# Patient Record
Sex: Male | Born: 1960 | Race: Black or African American | Hispanic: No | Marital: Married | Smoking: Never smoker
Health system: Southern US, Community
[De-identification: ages and names within clinical notes are randomized; demographics above are authoritative.]

## PROBLEM LIST (undated history)

## (undated) DIAGNOSIS — F5104 Psychophysiologic insomnia: Secondary | ICD-10-CM

## (undated) DIAGNOSIS — I10 Essential (primary) hypertension: Secondary | ICD-10-CM

## (undated) DIAGNOSIS — G4733 Obstructive sleep apnea (adult) (pediatric): Secondary | ICD-10-CM

## (undated) HISTORY — DX: Psychophysiologic insomnia: F51.04

## (undated) HISTORY — PX: OTHER SURGICAL HISTORY: SHX169

## (undated) HISTORY — DX: Obstructive sleep apnea (adult) (pediatric): G47.33

## (undated) HISTORY — DX: Essential (primary) hypertension: I10

---

## 1998-11-02 ENCOUNTER — Emergency Department (HOSPITAL_COMMUNITY): Admission: EM | Admit: 1998-11-02 | Discharge: 1998-11-02 | Payer: Self-pay | Admitting: Emergency Medicine

## 1999-05-26 ENCOUNTER — Emergency Department (HOSPITAL_COMMUNITY): Admission: EM | Admit: 1999-05-26 | Discharge: 1999-05-26 | Payer: Self-pay | Admitting: Emergency Medicine

## 2004-08-27 ENCOUNTER — Emergency Department (HOSPITAL_COMMUNITY): Admission: EM | Admit: 2004-08-27 | Discharge: 2004-08-27 | Payer: Self-pay | Admitting: Emergency Medicine

## 2004-08-29 ENCOUNTER — Emergency Department (HOSPITAL_COMMUNITY): Admission: EM | Admit: 2004-08-29 | Discharge: 2004-08-29 | Payer: Self-pay | Admitting: Emergency Medicine

## 2006-02-13 ENCOUNTER — Emergency Department (HOSPITAL_COMMUNITY): Admission: EM | Admit: 2006-02-13 | Discharge: 2006-02-13 | Payer: Self-pay | Admitting: Emergency Medicine

## 2006-05-30 ENCOUNTER — Emergency Department (HOSPITAL_COMMUNITY): Admission: EM | Admit: 2006-05-30 | Discharge: 2006-05-30 | Payer: Self-pay | Admitting: Emergency Medicine

## 2006-06-04 ENCOUNTER — Emergency Department (HOSPITAL_COMMUNITY): Admission: EM | Admit: 2006-06-04 | Discharge: 2006-06-04 | Payer: Self-pay | Admitting: Family Medicine

## 2008-01-17 ENCOUNTER — Emergency Department (HOSPITAL_COMMUNITY): Admission: EM | Admit: 2008-01-17 | Discharge: 2008-01-17 | Payer: Self-pay | Admitting: Emergency Medicine

## 2009-03-15 ENCOUNTER — Emergency Department (HOSPITAL_COMMUNITY): Admission: EM | Admit: 2009-03-15 | Discharge: 2009-03-15 | Payer: Self-pay | Admitting: Family Medicine

## 2009-03-15 ENCOUNTER — Telehealth: Payer: Self-pay | Admitting: Internal Medicine

## 2009-03-22 ENCOUNTER — Ambulatory Visit: Payer: Self-pay | Admitting: Internal Medicine

## 2009-03-22 DIAGNOSIS — F528 Other sexual dysfunction not due to a substance or known physiological condition: Secondary | ICD-10-CM

## 2009-03-22 DIAGNOSIS — I10 Essential (primary) hypertension: Secondary | ICD-10-CM | POA: Insufficient documentation

## 2009-04-02 ENCOUNTER — Emergency Department (HOSPITAL_COMMUNITY): Admission: EM | Admit: 2009-04-02 | Discharge: 2009-04-02 | Payer: Self-pay | Admitting: Emergency Medicine

## 2009-06-26 ENCOUNTER — Telehealth: Payer: Self-pay | Admitting: Internal Medicine

## 2009-06-27 ENCOUNTER — Ambulatory Visit: Payer: Self-pay | Admitting: Internal Medicine

## 2009-09-07 ENCOUNTER — Telehealth: Payer: Self-pay | Admitting: Internal Medicine

## 2009-09-08 ENCOUNTER — Telehealth: Payer: Self-pay | Admitting: Internal Medicine

## 2009-10-16 ENCOUNTER — Telehealth: Payer: Self-pay | Admitting: Internal Medicine

## 2009-11-21 ENCOUNTER — Telehealth: Payer: Self-pay | Admitting: Internal Medicine

## 2010-03-21 ENCOUNTER — Encounter: Payer: Self-pay | Admitting: Internal Medicine

## 2010-03-21 ENCOUNTER — Other Ambulatory Visit: Payer: Self-pay | Admitting: Internal Medicine

## 2010-03-21 ENCOUNTER — Ambulatory Visit
Admission: RE | Admit: 2010-03-21 | Discharge: 2010-03-21 | Payer: Self-pay | Source: Home / Self Care | Attending: Internal Medicine | Admitting: Internal Medicine

## 2010-03-21 ENCOUNTER — Telehealth: Payer: Self-pay | Admitting: Internal Medicine

## 2010-03-21 DIAGNOSIS — G4730 Sleep apnea, unspecified: Secondary | ICD-10-CM | POA: Insufficient documentation

## 2010-03-21 DIAGNOSIS — G4700 Insomnia, unspecified: Secondary | ICD-10-CM | POA: Insufficient documentation

## 2010-03-21 DIAGNOSIS — R9431 Abnormal electrocardiogram [ECG] [EKG]: Secondary | ICD-10-CM | POA: Insufficient documentation

## 2010-03-21 LAB — CBC WITH DIFFERENTIAL/PLATELET
Basophils Absolute: 0 10*3/uL (ref 0.0–0.1)
Eosinophils Absolute: 0.1 10*3/uL (ref 0.0–0.7)
Hemoglobin: 13.9 g/dL (ref 13.0–17.0)
Lymphocytes Relative: 42.3 % (ref 12.0–46.0)
MCHC: 33.7 g/dL (ref 30.0–36.0)
Monocytes Relative: 9.9 % (ref 3.0–12.0)
Neutro Abs: 2.6 10*3/uL (ref 1.4–7.7)
Neutrophils Relative %: 46.3 % (ref 43.0–77.0)
Platelets: 251 10*3/uL (ref 150.0–400.0)
RDW: 12.4 % (ref 11.5–14.6)

## 2010-03-21 LAB — LIPID PANEL
Cholesterol: 121 mg/dL (ref 0–200)
HDL: 37 mg/dL — ABNORMAL LOW (ref >39.00)
Triglycerides: 102 mg/dL (ref 0.0–149.0)
VLDL: 20.4 mg/dL (ref 0.0–40.0)

## 2010-03-21 LAB — URINALYSIS, ROUTINE W REFLEX MICROSCOPIC
Bilirubin Urine: NEGATIVE
Hemoglobin, Urine: NEGATIVE
Leukocytes, UA: NEGATIVE
Nitrite: NEGATIVE
pH: 5.5 (ref 5.0–8.0)

## 2010-03-21 LAB — HEPATIC FUNCTION PANEL
Albumin: 4.2 g/dL (ref 3.5–5.2)
Alkaline Phosphatase: 52 U/L (ref 39–117)
Total Bilirubin: 0.9 mg/dL (ref 0.3–1.2)

## 2010-03-21 LAB — BASIC METABOLIC PANEL
BUN: 13 mg/dL (ref 6–23)
CO2: 28 mEq/L (ref 19–32)
Calcium: 9.2 mg/dL (ref 8.4–10.5)
Creatinine, Ser: 1.3 mg/dL (ref 0.4–1.5)
GFR: 75.73 mL/min (ref >60.00)
Glucose, Bld: 115 mg/dL — ABNORMAL HIGH (ref 70–99)

## 2010-03-21 LAB — PSA: PSA: 0.75 ng/mL (ref 0.10–4.00)

## 2010-03-29 NOTE — Assessment & Plan Note (Signed)
Summary: NEW [employer] PT-PKG/OFF--#--BP 170/SOMETHING SEEN/[facility] CARE-ER IF...   Vital Signs:  Patient profile:   50 year old male Height:      72 inches Weight:      214 pounds BMI:     29.13 O2 Sat:      96 % on Room air Temp:     97.1 degrees F oral Pulse rate:   80 / minute Pulse rhythm:   regular Resp:     16 per minute BP sitting:   116 / 82  (right arm)  Nutrition Counseling: Patient's BMI is greater than 25 and therefore counseled on weight management options.  O2 Flow:  Room air  Primary Care Provider:  Janith Lima MD   History of Present Illness: New to me he needs a BP check, he was seen at [facility] 2 weeks ago and started on meds for hypertension which he tolerates though he has had some decreased libido and ED since starting meds (I think Clonidine is the culprit)  Hypertension History:      He complains of side effects from treatment, but denies headache, chest pain, palpitations, dyspnea with exertion, orthopnea, PND, peripheral edema, visual symptoms, neurologic problems, and syncope.  He notes the following problems with antihypertensive medication side effects: ED.        Positive major cardiovascular risk factors include male age 23 years old or older and hypertension.  Negative major cardiovascular risk factors include no history of diabetes or hyperlipidemia, negative family history for ischemic heart disease, and non-tobacco-user status.        Further assessment for target organ damage reveals no history of ASHD, cardiac end-organ damage (CHF/LVH), stroke/TIA, peripheral vascular disease, renal insufficiency, or hypertensive retinopathy.     Preventive Screening-Counseling & Management  Alcohol-Tobacco     Alcohol drinks/day: <1     Alcohol type: beer     >5/day in last 3 mos: no     Alcohol Counseling: not indicated; use of alcohol is not excessive or problematic     Feels need to cut down: no     Feels annoyed by complaints: no     Feels guilty re:  drinking: no     Needs 'eye opener' in am: no     Smoking Status: never  Caffeine-Diet-Exercise     Does Patient Exercise: yes  Hep-HIV-STD-Contraception     Hepatitis Risk: no risk noted     HIV Risk: no risk noted     STD Risk: no risk noted      Sexual History:  currently monogamous.        Drug Use:  never and no.        Blood Transfusions:  no.    Past History:  Past Medical History: Hypertension  Past Surgical History: Denies surgical history  Family History: Family History Hypertension  Social History: Occupation: Freight forwarder Married Never Smoked Alcohol use-yes Drug use-no Regular exercise-yes Smoking Status:  never Drug Use:  never, no Does Patient Exercise:  yes Hepatitis Risk:  no risk noted HIV Risk:  no risk noted STD Risk:  no risk noted Sexual History:  currently monogamous Blood Transfusions:  no  Review of Systems       The patient complains of weight gain.  The patient denies anorexia, fever, abdominal pain, difficulty walking, and depression.   GU:  Complains of decreased libido and erectile dysfunction; denies discharge, dysuria, hematuria, incontinence, nocturia, urinary frequency, and urinary hesitancy.  Physical Exam  General:  alert, well-developed, well-nourished, well-hydrated, appropriate dress, normal appearance, healthy-appearing, cooperative to examination, and overweight-appearing.   Head:  normocephalic, atraumatic, no abnormalities observed, and no abnormalities palpated.   Eyes:  No corneal or conjunctival inflammation noted. EOMI. Perrla. Funduscopic exam benign, without hemorrhages, exudates or papilledema. Vision grossly normal. Mouth:  Oral mucosa and oropharynx without lesions or exudates.  Teeth in good repair. Neck:  supple, full ROM, no masses, no thyromegaly, no thyroid nodules or tenderness, no JVD, normal carotid upstroke, and no carotid bruits.   Lungs:  normal respiratory effort, no intercostal retractions,  no accessory muscle use, normal breath sounds, no dullness, no fremitus, no crackles, and no wheezes.   Heart:  normal rate, regular rhythm, no murmur, no gallop, no rub, and no JVD.   Abdomen:  soft, non-tender, normal bowel sounds, no distention, no masses, no guarding, no rigidity, no hepatomegaly, and no splenomegaly.   Msk:  No deformity or scoliosis noted of thoracic or lumbar spine.   Pulses:  R and L carotid,radial,femoral,dorsalis pedis and posterior tibial pulses are full and equal bilaterally Extremities:  No clubbing, cyanosis, edema, or deformity noted with normal full range of motion of all joints.   Neurologic:  No cranial nerve deficits noted. Station and gait are normal. Plantar reflexes are down-going bilaterally. DTRs are symmetrical throughout. Sensory, motor and coordinative functions appear intact. Skin:  Intact without suspicious lesions or rashes Cervical Nodes:  No lymphadenopathy noted Psych:  Cognition and judgment appear intact. Alert and cooperative with normal attention span and concentration. No apparent delusions, illusions, hallucinations Additional Exam:  EKG is normal.   Impression & Recommendations:  Problem # 1:  ERECTILE DYSFUNCTION (ICD-302.72) Assessment New  His updated medication list for this problem includes:    Cialis 20 Mg Tabs (Tadalafil) .Marland Kitchen... Take as directed  Discussed proper use of medications, as well as side effects.   Problem # 2:  ESSENTIAL HYPERTENSION (ICD-401.9) Assessment: New  His updated medication list for this problem includes:    Azor 10-40 Mg Tabs (Amlodipine-olmesartan) ..... Once daily for high blood pressure  BP today: 116/82  Orders: EKG w/ Interpretation (93000)  Complete Medication List: 1)  Azor 10-40 Mg Tabs (Amlodipine-olmesartan) .... Once daily for high blood pressure 2)  Cialis 20 Mg Tabs (Tadalafil) .... Take as directed  Hypertension Assessment/Plan:      The patient's hypertensive risk group is  category B: At least one risk factor (excluding diabetes) with no target organ damage.  Today's blood pressure is 116/82.  His blood pressure goal is < 140/90.  Patient Instructions: 1)  Please schedule a follow-up appointment in 2 months. 2)  It is important that you exercise regularly at least 20 minutes 5 times a week. If you develop chest pain, have severe difficulty breathing, or feel very tired , stop exercising immediately and seek medical attention. 3)  You need to lose weight. Consider a lower calorie diet and regular exercise.  4)  Check your Blood Pressure regularly. If it is above 140/90: you should make an appointment. Prescriptions: CIALIS 20 MG TABS (TADALAFIL) Take as directed  #3 x 11   Entered and Authorized by:   Janith Lima MD   Signed by:   Janith Lima MD on 03/22/2009   Method used:   Print then Give to Patient   RxID:   [id number]    Tetanus/Td Immunization History:    Tetanus/Td # 1:  Tdap (06/03/2007)

## 2010-03-29 NOTE — Progress Notes (Signed)
Summary: SAMPLES  Phone Note Call from Patient Call back at Home Phone [phone]   Summary of Call: Patient is requesting samples of BP medication.  Initial call taken by: Charlsie Quest, [place],  November 21, 2009 2:16 PM  Follow-up for Phone Call        Pt informed  Follow-up by: Charlsie Quest, CMA,  November 22, 2009 11:37 AM    Prescriptions: AZOR 10-40 MG TABS (AMLODIPINE-OLMESARTAN) once daily for high blood pressure  #45 x 0   Entered by:   Charlsie Quest, CMA   Authorized by:   Janith Lima MD   Signed by:   Charlsie Quest, CMA on 11/22/2009   Method used:   Samples Given   RxID:   [id number]

## 2010-03-29 NOTE — Assessment & Plan Note (Signed)
Summary: elevated BP SD   Vital Signs:  Patient profile:   50 year old male Height:      72 inches Weight:      214 pounds BMI:     29.13 O2 Sat:      96 % on Room air Temp:     98.4 degrees F oral Pulse rate:   72 / minute Pulse rhythm:   regular Resp:     16 per minute BP sitting:   138 / 90  (left arm) Cuff size:   large  Vitals Entered By: Estell Harpin CMA (Jun 27, 2009 10:40 AM)  Nutrition Counseling: Patient's BMI is greater than 25 and therefore counseled on weight management options.  O2 Flow:  Room air CC: follow-up visit//discuss elevated BP and med refill Is Patient Diabetic? No Pain Assessment Patient in pain? no        Primary Care Provider:  Janith Lima MD  CC:  follow-up visit//discuss elevated BP and med refill.  History of Present Illness:  Hypertension Follow-Up      This is a 50 year old man who presents for Hypertension follow-up.  The patient denies lightheadedness, urinary frequency, headaches, edema, impotence, rash, and fatigue.  The patient denies the following associated symptoms: chest pain, chest pressure, exercise intolerance, dyspnea, palpitations, syncope, leg edema, and pedal edema.  Compliance with medications (by patient report) has been sporadic.  The patient reports that dietary compliance has been good.  The patient reports exercising daily.  Adjunctive measures currently used by the patient include salt restriction and relaxation.    Preventive Screening-Counseling & Management  Alcohol-Tobacco     Alcohol drinks/day: <1     Alcohol type: beer     >5/day in last 3 mos: no     Alcohol Counseling: not indicated; use of alcohol is not excessive or problematic     Feels need to cut down: no     Feels annoyed by complaints: no     Feels guilty re: drinking: no     Needs 'eye opener' in am: no     Smoking Status: never  Hep-HIV-STD-Contraception     Hepatitis Risk: no risk noted     HIV Risk: no risk noted     STD Risk: no  risk noted  Medications Prior to Update: 1)  Azor 10-40 Mg Tabs (Amlodipine-Olmesartan) .... Once Daily For High Blood Pressure 2)  Cialis 20 Mg Tabs (Tadalafil) .... Take As Directed  Current Medications (verified): 1)  Azor 10-40 Mg Tabs (Amlodipine-Olmesartan) .... Once Daily For High Blood Pressure 2)  Cialis 20 Mg Tabs (Tadalafil) .... Take As Directed  Allergies (verified): No Known Drug Allergies  Past History:  Past Medical History: Reviewed history from 03/22/2009 and no changes required. Hypertension  Past Surgical History: Reviewed history from 03/22/2009 and no changes required. Denies surgical history  Family History: Reviewed history from 03/22/2009 and no changes required. Family History Hypertension  Social History: Reviewed history from 03/22/2009 and no changes required. 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    Physical Exam  General:  alert, well-developed, well-nourished, well-hydrated, appropriate dress, normal appearance, healthy-appearing, cooperative to examination, and overweight-appearing.   Mouth:  Oral mucosa and oropharynx without lesions or exudates.  Teeth in good repair. Neck:  supple, full ROM, no masses, no thyromegaly, no thyroid nodules  or tenderness, no JVD, normal carotid upstroke, and no carotid bruits.   Lungs:  normal respiratory effort, no intercostal retractions, no accessory muscle use, normal breath sounds, no dullness, no fremitus, no crackles, and no wheezes.   Heart:  normal rate, regular rhythm, no murmur, no gallop, no rub, and no JVD.   Abdomen:  soft, non-tender, normal bowel sounds, no distention, no masses, no guarding, no rigidity, no hepatomegaly, and no splenomegaly.   Msk:  No deformity or scoliosis noted of thoracic or lumbar  spine.   Pulses:  R and L carotid,radial,femoral,dorsalis pedis and posterior tibial pulses are full and equal bilaterally Extremities:  No clubbing, cyanosis, edema, or deformity noted with normal full range of motion of all joints.   Neurologic:  No cranial nerve deficits noted. Station and gait are normal. Plantar reflexes are down-going bilaterally. DTRs are symmetrical throughout. Sensory, motor and coordinative functions appear intact. Skin:  Intact without suspicious lesions or rashes Cervical Nodes:  No lymphadenopathy noted Psych:  Cognition and judgment appear intact. Alert and cooperative with normal attention span and concentration. No apparent delusions, illusions, hallucinations   Impression & Recommendations:  Problem # 1:  HYPERTENSION (ICD-401.9) Assessment Unchanged  His updated medication list for this problem includes:    Azor 10-40 Mg Tabs (Amlodipine-olmesartan) ..... Once daily for high blood pressure  BP today: 138/90 Prior BP: 116/82 (03/22/2009)  Prior 10 Yr Risk Heart Disease: Not enough information (03/22/2009)  Complete Medication List: 1)  Azor 10-40 Mg Tabs (Amlodipine-olmesartan) .... Once daily for high blood pressure 2)  Cialis 20 Mg Tabs (Tadalafil) .... Take as directed  Patient Instructions: 1)  Please schedule a follow-up appointment in 4 months. 2)  It is important that you exercise regularly at least 20 minutes 5 times a week. If you develop chest pain, have severe difficulty breathing, or feel very tired , stop exercising immediately and seek medical attention. 3)  You need to lose weight. Consider a lower calorie diet and regular exercise.  4)  Check your Blood Pressure regularly. If it is above 140/90: you should make an appointment.

## 2010-03-29 NOTE — Progress Notes (Signed)
  Phone Note Other Incoming   Caller: [facility] Summary of Call: Pt cannot afford Azor, he needs another alternative. Please Advise. Initial call taken by: Ami Bullins CMA,  September 08, 2009 8:54 AM  Follow-up for Phone Call        will defer to Dr. Ronnald Ramp regarding medication changes. continue present meds until he can address this next week.  Follow-up by: Neena Rhymes MD,  September 08, 2009 1:18 PM  Additional Follow-up for Phone Call Additional follow up Details #1::        See 09/07/09 phone note//closing phone note.Ellison Hughs Archie CMA  September 08, 2009 1:32 PM

## 2010-03-29 NOTE — Progress Notes (Signed)
Summary: OK FOR RF?   Phone Note Refill Request Call back at [phone] - Wife Melissa   Refills Requested: Medication #1:  CIALIS 20 MG TABS Take as directed Initial call taken by: Charlsie Quest, [place],  March 21, 2010 11:55 AM    Prescriptions: CIALIS 20 MG TABS (TADALAFIL) Take as directed  #3 x 11   Entered and Authorized by:   Janith Lima MD   Signed by:   Janith Lima MD on 03/21/2010   Method used:   Electronically to        [facility] [phone]* (retail)       [address]       [place], [place]  [address]       Ph: [phone] or [phone]       Fax: [phone]   RxID:   [id number]

## 2010-03-29 NOTE — Assessment & Plan Note (Signed)
Summary: blood pressure/#/cd   Vital Signs:  Patient profile:   50 year old male Height:      72 inches Weight:      247 pounds BMI:     33.62 O2 Sat:      96 % on Room air Temp:     97.9 degrees F oral Pulse rate:   74 / minute Pulse rhythm:   regular Resp:     16 per minute BP sitting:   140 / 88  (left arm) Cuff size:   large  Vitals Entered By: Estell Harpin CMA (March 21, 2010 9:48 AM)  Nutrition Counseling: Patient's BMI is greater than 25 and therefore counseled on weight management options.  O2 Flow:  Room air CC: follow-up visit, Insomnia, Hypertension Management, Preventive Care Is Patient Diabetic? No Pain Assessment Patient in pain? no       Does patient need assistance? Functional Status Self care Ambulation Normal   Primary Care Provider:  Janith Lima MD  CC:  follow-up visit, Insomnia, Hypertension Management, and Preventive Care.  History of Present Illness:  Insomnia      This is a 50 year old man who presents with Insomnia.  The severity is described as mild.  The patient reports difficulty falling asleep, frequent awakening, snoring, apnea noted by partner, and daytime somnolence, but denies early awakening, nightmares, and leg movements.  Associated symptoms include weight gain.  Risk factors for insomnia include obesity and working third shift.  Behaviors that may contribute to insomnia include OTC sleep aids.    He also tells me that he does not think that Cialis is working b/c he still has low libido and ED, although he also tells me that his wife wants to have sex "all day, every day" and he can't keep up with her demands.  He tells me that he has had some remote SOB but none recently and he denies any CP or DOE.  Hypertension History:      He denies headache, chest pain, palpitations, dyspnea with exertion, orthopnea, PND, peripheral edema, visual symptoms, neurologic problems, syncope, and side effects from treatment.  He notes no  problems with any antihypertensive medication side effects.        Positive major cardiovascular risk factors include male age 51 years old or older and hypertension.  Negative major cardiovascular risk factors include no history of diabetes or hyperlipidemia, negative family history for ischemic heart disease, and non-tobacco-user status.        Further assessment for target organ damage reveals no history of ASHD, cardiac end-organ damage (CHF/LVH), stroke/TIA, peripheral vascular disease, renal insufficiency, or hypertensive retinopathy.     Current Medications (verified): 1)  Azor 10-40 Mg Tabs (Amlodipine-Olmesartan) .... Once Daily For High Blood Pressure 2)  Cialis 20 Mg Tabs (Tadalafil) .... Take As Directed  Allergies (verified): No Known Drug Allergies  Past History:  Past Medical History: Last updated: 03/22/2009 Hypertension  Past Surgical History: Last updated: 03/22/2009 Denies surgical history  Family History: Last updated: 03/22/2009 Family History Hypertension  Social History: Last updated: 03/22/2009 Occupation: Freight forwarder Married Never Smoked Alcohol use-yes Drug use-no Regular exercise-yes  Risk Factors: Alcohol Use: <1 (06/27/2009) >5 drinks/d w/in last 3 months: no (06/27/2009) Exercise: yes (03/22/2009)  Risk Factors: Smoking Status: never (06/27/2009)  Family History: Reviewed history from 03/22/2009 and no changes required. Family History Hypertension  Social History: Reviewed history from 03/22/2009 and no changes required. Occupation: Freight forwarder Married Never Smoked Alcohol use-yes Drug  use-no Regular exercise-yes  Review of Systems       The patient complains of weight gain.  The patient denies anorexia, fever, weight loss, chest pain, syncope, dyspnea on exertion, peripheral edema, prolonged cough, headaches, hemoptysis, abdominal pain, hematuria, incontinence, genital sores, suspicious skin lesions, transient  blindness, difficulty walking, depression, abnormal bleeding, enlarged lymph nodes, angioedema, and testicular masses.   Resp:  Complains of excessive snoring and hypersomnolence; denies chest discomfort, chest pain with inspiration, cough, coughing up blood, morning headaches, pleuritic, shortness of breath, sputum productive, and wheezing. GU:  Complains of decreased libido and erectile dysfunction; denies discharge, dysuria, genital sores, hematuria, incontinence, nocturia, urinary frequency, and urinary hesitancy. Psych:  Denies anxiety, depression, easily angered, easily tearful, irritability, mental problems, panic attacks, sense of great danger, suicidal thoughts/plans, and thoughts of violence.  Physical Exam  General:  alert, well-developed, well-nourished, well-hydrated, appropriate dress, normal appearance, healthy-appearing, cooperative to examination, good hygiene, and overweight-appearing.   Head:  normocephalic, atraumatic, no abnormalities observed, and no abnormalities palpated.   Eyes:  vision grossly intact, pupils equal, pupils round, and pupils reactive to light.   Ears:  R ear normal and L ear normal.   Nose:  External nasal examination shows no deformity or inflammation. Nasal mucosa are pink and moist without lesions or exudates. Mouth:  Oral mucosa and oropharynx without lesions or exudates.  Teeth in good repair. Neck:  supple, full ROM, no masses, no thyromegaly, no thyroid nodules or tenderness, no JVD, no HJR, normal carotid upstroke, no carotid bruits, no cervical lymphadenopathy, and no neck tenderness.   Lungs:  normal respiratory effort, no intercostal retractions, no accessory muscle use, normal breath sounds, no dullness, no fremitus, no crackles, and no wheezes.   Heart:  normal rate, regular rhythm, no murmur, no gallop, no rub, and no JVD.   Abdomen:  soft, non-tender, normal bowel sounds, no distention, no masses, no guarding, no rigidity, no rebound tenderness,  no abdominal hernia, no inguinal hernia, no hepatomegaly, and no splenomegaly.   Rectal:  No external abnormalities noted. Normal sphincter tone. No rectal masses or tenderness. Genitalia:  uncircumcised, no hydrocele, no varicocele, no scrotal masses, no testicular masses or atrophy, no cutaneous lesions, and no urethral discharge.   Prostate:  no gland enlargement, no nodules, no asymmetry, and no induration.   Msk:  No deformity or scoliosis noted of thoracic or lumbar spine.   Pulses:  R and L carotid,radial,femoral,dorsalis pedis and posterior tibial pulses are full and equal bilaterally Extremities:  No clubbing, cyanosis, edema, or deformity noted with normal full range of motion of all joints.   Neurologic:  No cranial nerve deficits noted. Station and gait are normal. Plantar reflexes are down-going bilaterally. DTRs are symmetrical throughout. Sensory, motor and coordinative functions appear intact. Skin:  Intact without suspicious lesions or rashes Cervical Nodes:  no anterior cervical adenopathy and no posterior cervical adenopathy.   Axillary Nodes:  no R axillary adenopathy and no L axillary adenopathy.   Inguinal Nodes:  no R inguinal adenopathy and no L inguinal adenopathy.   Psych:  Cognition and judgment appear intact. Alert and cooperative with normal attention span and concentration. No apparent delusions, illusions, hallucinations   Impression & Recommendations:  Problem # 1:  ROUTINE GENERAL MEDICAL EXAM@HEALTH  CARE FACL (ICD-V70.0) Assessment New  Orders: Gastroenterology Referral (GI)- for screening colonoscopy Hemoccult Guaiac-1 spec.(in office) (82270)  Td Booster: Tdap (06/03/2007)    Discussed using sunscreen, use of alcohol, drug use, self testicular exam, routine  dental care, routine eye care, routine physical exam, seat belts, multiple vitamins, osteoporosis prevention, adequate calcium intake in diet, and recommendations for immunizations.  Discussed exercise  and checking cholesterol.  Discussed gun safety, safe sex, and contraception. Also recommend checking PSA.  Problem # 2:  ABNORMAL ELECTROCARDIOGRAM (ICD-794.31) Assessment: New  His EKG has low voltage in V1 and V2- questionable Q waves, very questionable as to whether this is an old infarct and he does not have any symptoms so I will refer to Cardiology for evaluation  Orders: EKG w/ Interpretation (93000)  Problem # 3:  SLEEP APNEA (ICD-780.57) Assessment: New  Orders: Sleep Disorder Referral (Sleep Disorder)  Problem # 4:  ERECTILE DYSFUNCTION (ICD-302.72) Assessment: New will check for organic illness, I suspect that this is due to such high expectations and have advised of such, will treat any problems hat show up His updated medication list for this problem includes:    Cialis 20 Mg Tabs (Tadalafil) .Marland Kitchen... Take as directed  Orders: Venipuncture (66063) TLB-Lipid Panel (80061-LIPID) TLB-BMP (Basic Metabolic Panel-BMET) (01601-UXNATFT) TLB-CBC Platelet - w/Differential (85025-CBCD) TLB-Hepatic/Liver Function Pnl (80076-HEPATIC) TLB-TSH (Thyroid Stimulating Hormone) (84443-TSH) TLB-Testosterone, Total (84403-TESTO) TLB-PSA (Prostate Specific Antigen) (84153-PSA) TLB-Udip w/ Micro (81001-URINE)  Problem # 5:  HYPERTENSION (ICD-401.9) Assessment: Improved  His updated medication list for this problem includes:    Azor 10-40 Mg Tabs (Amlodipine-olmesartan) ..... Once daily for high blood pressure  Orders: Venipuncture (73220) TLB-Lipid Panel (80061-LIPID) TLB-BMP (Basic Metabolic Panel-BMET) (25427-CWCBJSE) TLB-CBC Platelet - w/Differential (85025-CBCD) TLB-Hepatic/Liver Function Pnl (80076-HEPATIC) TLB-TSH (Thyroid Stimulating Hormone) (84443-TSH) TLB-Testosterone, Total (84403-TESTO) TLB-PSA (Prostate Specific Antigen) (84153-PSA) TLB-Udip w/ Micro (81001-URINE)  BP today: 140/88 Prior BP: 138/90 (06/27/2009)  Prior 10 Yr Risk Heart Disease: Not enough  information (03/22/2009)  Problem # 6:  INSOMNIA-SLEEP DISORDER-UNSPEC (ICD-780.52) Assessment: New  His updated medication list for this problem includes:    Silenor 6 Mg Tabs (Doxepin hcl) ..... One by mouth at bedtime as needed for insomnia  Discussed sleep hygiene.   Complete Medication List: 1)  Azor 10-40 Mg Tabs (Amlodipine-olmesartan) .... Once daily for high blood pressure 2)  Cialis 20 Mg Tabs (Tadalafil) .... Take as directed 3)  Silenor 6 Mg Tabs (Doxepin hcl) .... One by mouth at bedtime as needed for insomnia  Hypertension Assessment/Plan:      The patient's hypertensive risk group is category B: At least one risk factor (excluding diabetes) with no target organ damage.  Today's blood pressure is 140/88.  His blood pressure goal is < 140/90.  Colorectal Screening:  Current Recommendations:    Hemoccult: NEG X 1 today    Colonoscopy recommended: scheduled with G.I.  PSA Screening:    Reviewed PSA screening recommendations: PSA ordered  Immunization & Chemoprophylaxis:    Tetanus vaccine: Tdap  (06/03/2007)  Patient Instructions: 1)  Please schedule a follow-up appointment in 1 month. 2)  It is important that you exercise regularly at least 20 minutes 5 times a week. If you develop chest pain, have severe difficulty breathing, or feel very tired , stop exercising immediately and seek medical attention. 3)  You need to lose weight. Consider a lower calorie diet and regular exercise.  4)  Schedule a colonoscopy/sigmoidoscopy to help detect colon cancer. 5)  Check your Blood Pressure regularly. If it is above 140/90: you should make an appointment. Prescriptions: SILENOR 6 MG TABS (DOXEPIN HCL) One by mouth at bedtime as needed for insomnia  #40 x 0   Entered and Authorized by:   Marcello Moores  Evalina Field MD   Signed by:   Janith Lima MD on 03/21/2010   Method used:   Samples Given   RxID:   [id number]    Orders Added: 1)  Venipuncture [60029] 2)  TLB-Lipid  Panel [80061-LIPID] 3)  TLB-BMP (Basic Metabolic Panel-BMET) [84730-YLUDAPT] 4)  TLB-CBC Platelet - w/Differential [85025-CBCD] 5)  TLB-Hepatic/Liver Function Pnl [80076-HEPATIC] 6)  TLB-TSH (Thyroid Stimulating Hormone) [84443-TSH] 7)  TLB-Testosterone, Total [84403-TESTO] 8)  TLB-PSA (Prostate Specific Antigen) [84153-PSA] 9)  TLB-Udip w/ Micro [81001-URINE] 10)  Sleep Disorder Referral [Sleep Disorder] 11)  Gastroenterology Referral [GI] 12)  EKG w/ Interpretation [93000] 13)  Hemoccult Guaiac-1 spec.(in office) [82270] 14)  Est. Patient Level V [00525]

## 2010-03-29 NOTE — Progress Notes (Signed)
Summary: ELEVATED BP  Phone Note Call from Patient Call back at [phone]   Summary of Call: Pt has been out of BP med x 2 mths. BP recently was 170/100. Pt's sister called office req refill. She and the pt are aware of dangers from high BP. Pt scheduled for office visit tomorrow. [facility] for 1 fill today?  Initial call taken by: Charlsie Quest, [place],  Jun 26, 2009 11:31 AM  Follow-up for Phone Call        yes Follow-up by: Janith Lima MD,  Jun 26, 2009 11:45 AM  Additional Follow-up for Phone Call Additional follow up Details #1::        Pt informed  Additional Follow-up by: Charlsie Quest, CMA,  Jun 26, 2009 1:54 PM    Prescriptions: AZOR 10-40 MG TABS (AMLODIPINE-OLMESARTAN) once daily for high blood pressure  #30 x 0   Entered by:   Charlsie Quest, CMA   Authorized by:   Janith Lima MD   Signed by:   Charlsie Quest, CMA on 06/26/2009   Method used:   Electronically to        [facility] [phone]* (retail)       [address]       [place], [place]  [address]       Ph: [phone] or [phone]       Fax: [phone]   RxID:   [id number]

## 2010-03-29 NOTE — Progress Notes (Signed)
Summary: samples  Phone Note Call from Patient Call back at Home Phone [phone]   Caller: Spouse Summary of Call: Patient wife called stating that pt was placed on Azor. Patient is currently out of work and has no way to get medication. They are requessting samples to help out. Initial call taken by: Estell Harpin CMA,  October 16, 2009 2:00 PM  Follow-up for Phone Call        Patient notified samples are up front for pick up.  Follow-up by: Ernestene Mention CMA,  October 16, 2009 2:30 PM

## 2010-03-29 NOTE — Progress Notes (Signed)
Summary: Azor  Phone Note Call from Patient Call back at [phone]   Caller: Patient Summary of Call: Per wife, pt wqas given rx for Azor w/ a discount card and after checking with pharmacy rx will still cost $128. They are requesting that instead of combo Azor they are given two separate rx. Please advise Thanks Initial call taken by: Estell Harpin CMA,  September 07, 2009 9:12 AM  Follow-up for Phone Call        remember , the two meds would be benicar and amlodipine;  the benicar is still brand name and I think may also be the same price as above - does she really want this? Follow-up by: Biagio Borg MD,  September 07, 2009 2:57 PM  Additional Follow-up for Phone Call Additional follow up Details #1::        PT'S WIFE NOTIFIED AND WILL CONTINUE USE OF AZOR. 1 MONTH SAMPLE PUT UPFRONT FOR PT PICK UP Additional Follow-up by: Crissie Sickles, [place],  September 07, 2009 4:20 PM

## 2010-03-29 NOTE — Letter (Signed)
Summary: Lipid Letter  [facility]   [place], [place] [address]   Phone: [phone]  Fax: [phone]    03/21/2010  [facility] [address] [place]  [address]  Dear Brad Hayes:  We have carefully reviewed your last lipid profile from 03/21/2010 and the results are noted below with a summary of recommendations for lipid management.    Cholesterol:       121     Goal: <200   HDL "good" Cholesterol:   37.00     Goal: >40   LDL "bad" Cholesterol:   64     Goal: <130   Triglycerides:       102.0     Goal: <150    your testosterone level is low and your blood sugar is a little high, the prostate test and other labs look good, please let me know if you want to try a testosterone replacement    TLC Diet (Therapeutic Lifestyle Change): Saturated Fats & Transfatty acids should be kept < 7% of total calories ***Reduce Saturated Fats Polyunstaurated Fat can be up to 10% of total calories Monounsaturated Fat Fat can be up to 20% of total calories Total Fat should be no greater than 25-35% of total calories Carbohydrates should be 50-60% of total calories Protein should be approximately 15% of total calories Fiber should be at least 20-30 grams a day ***Increased fiber may help lower LDL Total Cholesterol should be < 242m/day Consider adding plant stanol/sterols to diet (example: Benacol spread) ***A higher intake of unsaturated fat may reduce Triglycerides and Increase HDL    Adjunctive Measures (may lower LIPIDS and reduce risk of Heart Attack) include: Aerobic Exercise (20-30 minutes 3-4 times a week) Limit Alcohol Consumption Weight Reduction Aspirin 75-81 mg a day by mouth (if not allergic or contraindicated) Dietary Fiber 20-30 grams a day by mouth     Current Medications: 1)    Azor 10-40 Mg Tabs (Amlodipine-olmesartan) .... Once daily for high blood pressure 2)    Cialis 20 Mg Tabs (Tadalafil) .... Take as directed 3)    Silenor 6 Mg  Tabs (Doxepin hcl) .... One by mouth at bedtime as needed for insomnia  If you have any questions, please call. We appreciate being able to work with you.   Sincerely,    [facility] TJanith LimaMD

## 2010-03-29 NOTE — Progress Notes (Signed)
Summary: B/P HIGH   Phone Note Call from Patient Call back at [phone]   Caller: sister/SHARRON Carmicheal Summary of Call: PT B/P IS 170/102 TOOK IN LEFT ARM. Initial call taken by: Delsa Sale,  March 15, 2009 4:09 PM  Follow-up for Phone Call        left mess w/Sharron that we will note his BP but he have not seen pt yet so he should f/u w/pcp if needed before his appt w/us in Feb. Kevan Rosebush, RN  March 15, 2009 4:48 PM   pt is sch to see Dr Ronnald Ramp on 1/26 Kevan Rosebush, RN  March 16, 2009 3:14 PM

## 2010-03-30 ENCOUNTER — Institutional Professional Consult (permissible substitution) (INDEPENDENT_AMBULATORY_CARE_PROVIDER_SITE_OTHER): Payer: Self-pay | Admitting: Internal Medicine

## 2010-03-30 ENCOUNTER — Encounter: Payer: Self-pay | Admitting: Internal Medicine

## 2010-03-30 DIAGNOSIS — G4730 Sleep apnea, unspecified: Secondary | ICD-10-CM

## 2010-03-30 DIAGNOSIS — G4700 Insomnia, unspecified: Secondary | ICD-10-CM

## 2010-04-04 NOTE — Assessment & Plan Note (Addendum)
Summary: sleep apena/cb   Vital Signs:  Patient profile:   50 year old male Height:      72 inches Weight:      246.25 pounds BMI:     33.52 O2 Sat:      96 % on Room air Pulse rate:   99 / minute BP sitting:   144 / 90  (right arm) Cuff size:   large  Vitals Entered By: Raymondo Band RN (March 30, 2010 2:06 PM)  O2 Flow:  Room air CC: Sleep Consult - insomnia Comments Medications reviewed with patient Daytime contact number verified with patient. Raymondo Band RN  March 30, 2010 2:06 PM    Copy to:  Dr. Scarlette Calico Primary Provider/Referring Provider:  Janith Lima MD  CC:  Sleep Consult - insomnia.  History of Present Illness: March 30, 2010- 50yoM referred courtesy of Dr Ronnald Ramp for complaint of insomnia with difficulty initiating and maintaining sleep. He says he has never been a good sleeper, and estimates that he only gets about 20 minutes f sleep in most 24 hour days. He worked 3rd shift for 20 years, until laid off in March, 2011.He is most apt to sleep between 5AM and 730AM. States he disturbs wife because of his acitivity up at night and watching TV. Denies feeling sleepy or tired, never naps. May doze off watching TV for 20-30 minutes. Dr Ronnald Ramp just gave Silenor 6 mg with no effect yet after 2 nights. He denies ever taking a sleep med previously. Little caffeine. Never napped well as a child, never sleepy in class. Occasional dream recall, but not nightmares and denies sleep walking. Wife tells him when he does sleep, he is apt to snore, stop breathing and kick in sleep.  He denies any hx of neurologic disorder, seizure, head injury. Never heavy ethanol or street drugs. Sedated only once for dental extraction without other anesthesia experience.  Currently he is getting up at 730AM to go to gym, trying to take better care of himself. He states that he isn't personally bothered by his sleep pattern, doesn't worry that he doesn't sleep, and is really responding to  his wife's request that he get evaluation.   Preventive Screening-Counseling & Management  Alcohol-Tobacco     Alcohol drinks/day: <1     Smoking Status: never  Current Medications (verified): 1)  Azor 10-40 Mg Tabs (Amlodipine-Olmesartan) .... Once Daily For High Blood Pressure 2)  Cialis 20 Mg Tabs (Tadalafil) .... Take As Directed 3)  Silenor 6 Mg Tabs (Doxepin Hcl) .... 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  The patient denies shortness of breath with activity, shortness of breath at rest, productive cough, non-productive cough, coughing up blood, acid heartburn, indigestion, loss of appetite, abdominal pain, difficulty swallowing, sore throat, tooth/dental problems, headaches, nasal congestion/difficulty breathing through nose, sneezing, itching, ear ache, anxiety, depression,  hand/feet swelling, joint stiffness or pain, rash, change in color of mucus, and fever.    Physical Exam  Additional Exam:  General: A/Ox3; pleasant and cooperative, NAD, healthy and comfortable appearing SKIN: no rash, lesions NODES: no lymphadenopathy HEENT: [place]/AT, EOM- WNL, Conjuctivae- clear, PERRLA, TM-WNL, Nose- clear, Throat- clear and wnl. Mallampati  III NECK: Supple w/ fair ROM, JVD- none, normal carotid impulses w/o bruits Thyroid- normal to palpation CHEST: Clear to P&A HEART: RRR, no m/g/r heard ABDOMEN: Soft and nl; nml bowel sounds; no organomegaly or masses noted KPQ:AESL, nl pulses, no edema  NEURO: Grossly intact to observation, no tremor or diaphores      Impression & Recommendations:  Problem # 1:  INSOMNIA-SLEEP DISORDER-UNSPEC (ICD-780.52) He describes a chronic very light sleep need. Significantly, he denies being disturbed or inconvenienced by this pattern and really has no interest in trying other meds or interventions. Without that, there seems little point in offering other assessment or cognitive behavioral therapy. I did try to educate him that such approaches are available if he changes his mind.  I don't think he is describing normal sleep need with circadian rhythm schedule disturbance.  I woud also explore the possibility that he significantly under-reports his sleep time. This would be evaluated with a sleep diary and by interviewing his wife. His updated medication list for this problem includes:    Silenor 6 Mg Tabs (Doxepin hcl) ..... One by mouth at bedtime as needed for insomnia  Problem # 2:  SLEEP APNEA (ICD-780.57) History suggests he might have sleep apnea during the time when he does sleep. I explained the medical issues. He did not want to consider a sleep study at this time, believing he would only lie there awake. He did not want to take medication for such a study. I offered some simple measures to reduce snoring.   Other  Orders: Consultation Level III (75300)  Patient Instructions: 1)  Please schedule a follow-up appointment as needed. 2)  We can talk again about testing for the medical problem of sleep apnea if you decide. - pamphlet given 3)  Help for snoring: 4)  Chin strap 5)  Sleep off flat of back 6)  Mouth pieces, like on TV or the "boil and bite" type at the drug store 7)  cc Dr Ronnald Ramp   Orders Added: 1)  Consultation Level III [51102]

## 2010-04-05 ENCOUNTER — Encounter (INDEPENDENT_AMBULATORY_CARE_PROVIDER_SITE_OTHER): Payer: Self-pay | Admitting: *Deleted

## 2010-04-12 NOTE — Letter (Signed)
Summary: Pre Visit Letter Revised  [facility]  [place], [place] [address]   Phone: [phone]  Fax: [phone]        04/05/2010 MRN: [id number] Brad Hayes [address] [place], [place]  [address]  [place]             Procedure Date:  05-18-10   Welcome to the Gastroenterology Division at [facility].    You are scheduled to see a nurse for your pre-procedure visit on 05-04-10 at 10:30A.M. on the 3rd floor at [facility], [place] [employer].  We ask that you try to arrive at our office 15 minutes prior to your appointment time to allow for check-in.  Please take a minute to review the attached form.  If you answer "Yes" to one or more of the questions on the first page, we ask that you call the person listed at your earliest opportunity.  If you answer "No" to all of the questions, please complete the rest of the form and bring it to your appointment.    Your nurse visit will consist of discussing your medical and surgical history, your immediate family medical history, and your medications.   If you are unable to list all of your medications on the form, please bring the medication bottles to your appointment and we will list them.  We will need to be aware of both prescribed and over the counter drugs.  We will need to know exact dosage information as well.    Please be prepared to read and sign documents such as consent forms, a financial agreement, and acknowledgement forms.  If necessary, and with your consent, a friend or relative is welcome to sit-in on the nurse visit with you.  Please bring your insurance card so that we may make a copy of it.  If your insurance requires a referral to see a specialist, please bring your referral form from your primary care physician.  No co-pay is required for this nurse visit.     If you cannot keep your appointment, please call [phone] to cancel or reschedule prior to your appointment date.  This  allows [place] the opportunity to schedule an appointment for another patient in need of care.    Thank you for choosing [facility] for your medical needs.  We appreciate the opportunity to care for you.  Please visit [place] at our website  to learn more about our practice.  Sincerely, The Gastroenterology Division

## 2010-05-18 ENCOUNTER — Other Ambulatory Visit: Payer: Self-pay | Admitting: Gastroenterology

## 2010-07-06 ENCOUNTER — Telehealth: Payer: Self-pay | Admitting: *Deleted

## 2010-07-06 NOTE — Telephone Encounter (Signed)
Patient requesting samples of azor 10/40

## 2010-07-10 NOTE — Telephone Encounter (Signed)
Patient notified and samples placed upfront

## 2010-09-24 ENCOUNTER — Telehealth: Payer: Self-pay | Admitting: *Deleted

## 2010-09-24 NOTE — Telephone Encounter (Signed)
Patient requesting samples of azor.

## 2010-09-24 NOTE — Telephone Encounter (Signed)
Samples placed upfront/LMOVM

## 2010-11-04 ENCOUNTER — Emergency Department (HOSPITAL_COMMUNITY)
Admission: EM | Admit: 2010-11-04 | Discharge: 2010-11-04 | Disposition: A | Discharge status: Home / Self Care | Payer: Self-pay | Attending: Emergency Medicine | Admitting: Emergency Medicine

## 2010-11-04 ENCOUNTER — Emergency Department (HOSPITAL_COMMUNITY): Payer: Self-pay

## 2010-11-04 DIAGNOSIS — I10 Essential (primary) hypertension: Secondary | ICD-10-CM | POA: Insufficient documentation

## 2010-11-04 LAB — POCT I-STAT, CHEM 8
BUN: 15 mg/dL (ref 6–23)
Calcium, Ion: 1.21 mmol/L (ref 1.12–1.32)
Chloride: 103 mEq/L (ref 96–112)
Glucose, Bld: 100 mg/dL — ABNORMAL HIGH (ref 70–99)
TCO2: 26 mmol/L (ref 0–100)

## 2010-11-04 LAB — URINALYSIS, ROUTINE W REFLEX MICROSCOPIC
Glucose, UA: NEGATIVE mg/dL
Hgb urine dipstick: NEGATIVE
Specific Gravity, Urine: 1.02 (ref 1.005–1.030)
pH: 5.5 (ref 5.0–8.0)

## 2010-11-06 LAB — URINE CULTURE
Colony Count: NO GROWTH
Culture  Setup Time: 201209092144
Culture: NO GROWTH

## 2010-12-17 ENCOUNTER — Telehealth: Payer: Self-pay

## 2010-12-17 NOTE — Telephone Encounter (Signed)
Patient called requesting samples for BP

## 2011-06-14 ENCOUNTER — Encounter: Payer: Self-pay | Admitting: Internal Medicine

## 2011-06-14 ENCOUNTER — Ambulatory Visit (INDEPENDENT_AMBULATORY_CARE_PROVIDER_SITE_OTHER): Payer: PRIVATE HEALTH INSURANCE | Admitting: Internal Medicine

## 2011-06-14 VITALS — BP 144/98 | HR 75 | Temp 98.3°F | Resp 16 | Wt 223.0 lb

## 2011-06-14 DIAGNOSIS — G4730 Sleep apnea, unspecified: Secondary | ICD-10-CM

## 2011-06-14 DIAGNOSIS — F528 Other sexual dysfunction not due to a substance or known physiological condition: Secondary | ICD-10-CM

## 2011-06-14 DIAGNOSIS — I10 Essential (primary) hypertension: Secondary | ICD-10-CM

## 2011-06-14 DIAGNOSIS — G4700 Insomnia, unspecified: Secondary | ICD-10-CM

## 2011-06-14 MED ORDER — AMLODIPINE-OLMESARTAN 10-40 MG PO TABS: 1.0000 | ORAL_TABLET | Freq: Every day | ORAL | Status: DC

## 2011-06-14 MED ORDER — TADALAFIL 20 MG PO TABS: 20.0000 mg | ORAL_TABLET | Freq: Every day | ORAL | Status: DC | PRN

## 2011-06-14 MED ORDER — DOXEPIN HCL 6 MG PO TABS: 1.0000 | ORAL_TABLET | Freq: Every evening | ORAL | Status: DC | PRN

## 2011-06-14 NOTE — Patient Instructions (Signed)

## 2011-06-14 NOTE — Progress Notes (Signed)
Subjective:    Patient ID: Brad Hayes, male    DOB: [birth year], 51 y.o.   MRN: [id number]  Hypertension This is a chronic problem. The current episode started more than 1 year ago. The problem has been gradually worsening since onset. The problem is uncontrolled. Pertinent negatives include no anxiety, blurred vision, chest pain, headaches, malaise/fatigue, neck pain, orthopnea, palpitations, peripheral edema, PND, shortness of breath or sweats. There are no associated agents to hypertension. Past treatments include nothing. Compliance problems include psychosocial issues.  Identifiable causes of hypertension include sleep apnea.      Review of Systems  Constitutional: Negative for fever, chills, malaise/fatigue, diaphoresis, activity change, appetite change, fatigue and unexpected weight change.  HENT: Negative.  Negative for neck pain.   Eyes: Negative.  Negative for blurred vision.  Respiratory: Positive for apnea (and snoring). Negative for cough, choking, chest tightness, shortness of breath, wheezing and stridor.   Cardiovascular: Negative for chest pain, palpitations, orthopnea, leg swelling and PND.  Gastrointestinal: Negative.   Genitourinary: Negative.   Musculoskeletal: Negative for myalgias, back pain, joint swelling, arthralgias and gait problem.  Skin: Negative for color change, pallor, rash and wound.  Neurological: Negative for dizziness, tremors, seizures, syncope, facial asymmetry, speech difficulty, weakness, light-headedness, numbness and headaches.  Hematological: Negative for adenopathy. Does not bruise/bleed easily.  Psychiatric/Behavioral: Positive for sleep disturbance (DFA and FA's). Negative for suicidal ideas, hallucinations, behavioral problems, confusion, self-injury, dysphoric mood, decreased concentration and agitation. The patient is not nervous/anxious and is not hyperactive.        Objective:   Physical Exam  Vitals reviewed. Constitutional: He is  oriented to person, place, and time. He appears well-developed and well-nourished. No distress.  HENT:  Head: Normocephalic and atraumatic.  Mouth/Throat: Oropharynx is clear and moist. No oropharyngeal exudate.  Eyes: Conjunctivae are normal. Right eye exhibits no discharge. Left eye exhibits no discharge. No scleral icterus.  Neck: Normal range of motion. Neck supple. No JVD present. No tracheal deviation present. No thyromegaly present.  Cardiovascular: Normal rate, regular rhythm, normal heart sounds and intact distal pulses.  Exam reveals no gallop and no friction rub.   No murmur heard. Pulmonary/Chest: Effort normal and breath sounds normal. No stridor. No respiratory distress. He has no wheezes. He has no rales. He exhibits no tenderness.  Abdominal: Soft. Bowel sounds are normal. He exhibits no distension and no mass. There is no tenderness. There is no rebound and no guarding.  Musculoskeletal: Normal range of motion. He exhibits no edema.  Lymphadenopathy:    He has no cervical adenopathy.  Neurological: He is oriented to person, place, and time.  Skin: Skin is warm and dry. No rash noted. He is not diaphoretic. No erythema. No pallor.  Psychiatric: He has a normal mood and affect. His behavior is normal. Judgment and thought content normal.     Lab Results  Component Value Date   WBC 5.6 03/21/2010   HGB 16.3 11/04/2010   HCT 48.0 11/04/2010   PLT 251.0 03/21/2010   GLUCOSE 100* 11/04/2010   CHOL 121 03/21/2010   TRIG 102.0 03/21/2010   HDL 37.00* 03/21/2010   LDLCALC 64 03/21/2010   ALT 40 03/21/2010   AST 31 03/21/2010   NA 138 11/04/2010   K 4.2 11/04/2010   CL 103 11/04/2010   CREATININE 1.30 11/04/2010   BUN 15 11/04/2010   CO2 28 03/21/2010   TSH 1.53 03/21/2010   PSA 0.75 03/21/2010       Assessment & Plan:

## 2011-06-14 NOTE — Assessment & Plan Note (Signed)
He is doing well on silenor, I think he needs a f/up sleep eval about the apnea

## 2011-06-14 NOTE — Assessment & Plan Note (Signed)
I have asked him to have a f/up on his OSA

## 2011-06-14 NOTE — Assessment & Plan Note (Signed)
He has responded well to cialis

## 2011-06-14 NOTE — Assessment & Plan Note (Signed)
He will restart azor

## 2011-07-08 ENCOUNTER — Institutional Professional Consult (permissible substitution): Payer: PRIVATE HEALTH INSURANCE | Admitting: Pulmonary Disease

## 2011-08-12 ENCOUNTER — Telehealth: Payer: Self-pay | Admitting: *Deleted

## 2011-08-12 NOTE — Telephone Encounter (Signed)
Pt called requesting samples of BP medication, left message on VM informing pt samples upfront ready for pickup.

## 2011-09-13 ENCOUNTER — Emergency Department (HOSPITAL_COMMUNITY): Payer: PRIVATE HEALTH INSURANCE

## 2011-09-13 ENCOUNTER — Emergency Department (HOSPITAL_COMMUNITY)
Admission: EM | Admit: 2011-09-13 | Discharge: 2011-09-13 | Disposition: A | Discharge status: Home / Self Care | Payer: PRIVATE HEALTH INSURANCE | Attending: Emergency Medicine | Admitting: Emergency Medicine

## 2011-09-13 ENCOUNTER — Encounter (HOSPITAL_COMMUNITY): Payer: Self-pay | Admitting: *Deleted

## 2011-09-13 DIAGNOSIS — S39012A Strain of muscle, fascia and tendon of lower back, initial encounter: Secondary | ICD-10-CM

## 2011-09-13 DIAGNOSIS — I10 Essential (primary) hypertension: Secondary | ICD-10-CM | POA: Insufficient documentation

## 2011-09-13 DIAGNOSIS — G4733 Obstructive sleep apnea (adult) (pediatric): Secondary | ICD-10-CM | POA: Insufficient documentation

## 2011-09-13 DIAGNOSIS — T148XXA Other injury of unspecified body region, initial encounter: Secondary | ICD-10-CM

## 2011-09-13 DIAGNOSIS — Y92410 Unspecified street and highway as the place of occurrence of the external cause: Secondary | ICD-10-CM | POA: Insufficient documentation

## 2011-09-13 DIAGNOSIS — IMO0002 Reserved for concepts with insufficient information to code with codable children: Secondary | ICD-10-CM | POA: Insufficient documentation

## 2011-09-13 MED ORDER — CYCLOBENZAPRINE HCL 10 MG PO TABS: 10.0000 mg | ORAL_TABLET | Freq: Two times a day (BID) | ORAL | Status: AC | PRN

## 2011-09-13 MED ORDER — HYDROCODONE-ACETAMINOPHEN 5-325 MG PO TABS: 2.0000 | ORAL_TABLET | ORAL | Status: AC | PRN
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
## 2011-09-20 ENCOUNTER — Ambulatory Visit (INDEPENDENT_AMBULATORY_CARE_PROVIDER_SITE_OTHER): Payer: PRIVATE HEALTH INSURANCE | Admitting: Internal Medicine

## 2011-09-20 ENCOUNTER — Encounter: Payer: Self-pay | Admitting: Internal Medicine

## 2011-09-20 VITALS — BP 148/98 | HR 75 | Temp 97.7°F | Resp 16 | Wt 228.8 lb

## 2011-09-20 DIAGNOSIS — M545 Low back pain: Secondary | ICD-10-CM | POA: Insufficient documentation

## 2011-09-20 DIAGNOSIS — I10 Essential (primary) hypertension: Secondary | ICD-10-CM

## 2011-09-20 DIAGNOSIS — M546 Pain in thoracic spine: Secondary | ICD-10-CM

## 2011-09-20 MED ORDER — ETODOLAC ER 400 MG PO TB24: 400.0000 mg | ORAL_TABLET | Freq: Every day | ORAL | Status: AC

## 2011-09-20 MED ORDER — AMLODIPINE-OLMESARTAN 10-40 MG PO TABS: 1.0000 | ORAL_TABLET | Freq: Every day | ORAL | Status: DC

## 2011-09-20 NOTE — Patient Instructions (Signed)
Back Pain, Adult Low back pain is very common. About 1 in 5 people have back pain.The cause of low back pain is rarely dangerous. The pain often gets better over time.About half of people with a sudden onset of back pain feel better in just 2 weeks. About 8 in 10 people feel better by 6 weeks.  CAUSES Some common causes of back pain include:  Strain of the muscles or ligaments supporting the spine.   Wear and tear (degeneration) of the spinal discs.   Arthritis.   Direct injury to the back.  DIAGNOSIS Most of the time, the direct cause of low back pain is not known.However, back pain can be treated effectively even when the exact cause of the pain is unknown.Answering your caregiver's questions about your overall health and symptoms is one of the most accurate ways to make sure the cause of your pain is not dangerous. If your caregiver needs more information, he or she may order lab work or imaging tests (X-rays or MRIs).However, even if imaging tests show changes in your back, this usually does not require surgery. HOME CARE INSTRUCTIONS For many people, back pain returns.Since low back pain is rarely dangerous, it is often a condition that people can learn to [facility] their own.   Remain active. It is stressful on the back to sit or stand in one place. Do not sit, drive, or stand in one place for more than 30 minutes at a time. Take short walks on level surfaces as soon as pain allows.Try to increase the length of time you walk each day.   Do not stay in bed.Resting more than 1 or 2 days can delay your recovery.   Do not avoid exercise or work.Your body is made to move.It is not dangerous to be active, even though your back may hurt.Your back will likely heal faster if you return to being active before your pain is gone.   Pay attention to your body when you bend and lift. Many people have less discomfortwhen lifting if they bend their knees, keep the load close to their  bodies,and avoid twisting. Often, the most comfortable positions are those that put less stress on your recovering back.   Find a comfortable position to sleep. Use a firm mattress and lie on your side with your knees slightly bent. If you lie on your back, put a pillow under your knees.   Only take over-the-counter or prescription medicines as directed by your caregiver. Over-the-counter medicines to reduce pain and inflammation are often the most helpful.Your caregiver may prescribe muscle relaxant drugs.These medicines help dull your pain so you can more quickly return to your normal activities and healthy exercise.   Put ice on the injured area.   Put ice in a plastic bag.   Place a towel between your skin and the bag.   Leave the ice on for 15 to 20 minutes, 3 to 4 times a day for the first 2 to 3 days. After that, ice and heat may be alternated to reduce pain and spasms.   Ask your caregiver about trying back exercises and gentle massage. This may be of some benefit.   Avoid feeling anxious or stressed.Stress increases muscle tension and can worsen back pain.It is important to recognize when you are anxious or stressed and learn ways to manage it.Exercise is a great option.  SEEK MEDICAL CARE IF:  You have pain that is not relieved with rest or medicine.   You have  pain that does not improve in 1 week.   You have new symptoms.   You are generally not feeling well.  SEEK IMMEDIATE MEDICAL CARE IF:   You have pain that radiates from your back into your legs.   You develop new bowel or bladder control problems.   You have unusual weakness or numbness in your arms or legs.   You develop nausea or vomiting.   You develop abdominal pain.   You feel faint.  Document Released: 02/11/2005 Document Revised: 01/31/2011 Document Reviewed: 07/02/2010 [facility] Patient Information 2012 [place].Hypertension As your heart beats, it forces blood through your arteries. This  force is your blood pressure. If the pressure is too high, it is called hypertension (HTN) or high blood pressure. HTN is dangerous because you may have it and not know it. High blood pressure may mean that your heart has to work harder to pump blood. Your arteries may be narrow or stiff. The extra work puts you at risk for heart disease, stroke, and other problems.  Blood pressure consists of two numbers, a higher number over a lower, 110/72, for example. It is stated as "110 over 72." The ideal is below 120 for the top number (systolic) and under 80 for the bottom (diastolic). Write down your blood pressure today. You should pay close attention to your blood pressure if you have certain conditions such as:  Heart failure.   Prior heart attack.   Diabetes   Chronic kidney disease.   Prior stroke.   Multiple risk factors for heart disease.  To see if you have HTN, your blood pressure should be measured while you are seated with your arm held at the level of the heart. It should be measured at least twice. A one-time elevated blood pressure reading (especially in the Emergency Department) does not mean that you need treatment. There may be conditions in which the blood pressure is different between your right and left arms. It is important to see your caregiver soon for a recheck. Most people have essential hypertension which means that there is not a specific cause. This type of high blood pressure may be lowered by changing lifestyle factors such as:  Stress.   Smoking.   Lack of exercise.   Excessive weight.   Drug/tobacco/alcohol use.   Eating less salt.  Most people do not have symptoms from high blood pressure until it has caused damage to the body. Effective treatment can often prevent, delay or reduce that damage. TREATMENT  When a cause has been identified, treatment for high blood pressure is directed at the cause. There are a large number of medications to treat HTN. These  fall into several categories, and your caregiver will help you select the medicines that are best for you. Medications may have side effects. You should review side effects with your caregiver. If your blood pressure stays high after you have made lifestyle changes or started on medicines,   Your medication(s) may need to be changed.   Other problems may need to be addressed.   Be certain you understand your prescriptions, and know how and when to take your medicine.   Be sure to follow up with your caregiver within the time frame advised (usually within two weeks) to have your blood pressure rechecked and to review your medications.   If you are taking more than one medicine to lower your blood pressure, make sure you know how and at what times they should be taken. Taking  two medicines at the same time can result in blood pressure that is too low.  SEEK IMMEDIATE MEDICAL CARE IF:  You develop a severe headache, blurred or changing vision, or confusion.   You have unusual weakness or numbness, or a faint feeling.   You have severe chest or abdominal pain, vomiting, or breathing problems.  MAKE SURE YOU:   Understand these instructions.   Will watch your condition.   Will get help right away if you are not doing well or get worse.  Document Released: 02/11/2005 Document Revised: 01/31/2011 Document Reviewed: 10/02/2007 [facility] Patient Information 2012 [place].
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
## 2011-09-22 NOTE — Assessment & Plan Note (Signed)
His BP is uncontrolled due to non-compliance, he will restart Azor today
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
## 2012-03-10 ENCOUNTER — Encounter: Payer: Self-pay | Admitting: Internal Medicine

## 2012-03-10 ENCOUNTER — Other Ambulatory Visit (INDEPENDENT_AMBULATORY_CARE_PROVIDER_SITE_OTHER): Payer: BC Managed Care – PPO

## 2012-03-10 ENCOUNTER — Ambulatory Visit (INDEPENDENT_AMBULATORY_CARE_PROVIDER_SITE_OTHER): Payer: BC Managed Care – PPO | Admitting: Internal Medicine

## 2012-03-10 VITALS — BP 122/80 | HR 93 | Temp 98.1°F | Resp 16 | Wt 242.0 lb

## 2012-03-10 DIAGNOSIS — Z0000 Encounter for general adult medical examination without abnormal findings: Secondary | ICD-10-CM

## 2012-03-10 DIAGNOSIS — R7309 Other abnormal glucose: Secondary | ICD-10-CM

## 2012-03-10 DIAGNOSIS — F528 Other sexual dysfunction not due to a substance or known physiological condition: Secondary | ICD-10-CM

## 2012-03-10 DIAGNOSIS — R9431 Abnormal electrocardiogram [ECG] [EKG]: Secondary | ICD-10-CM

## 2012-03-10 DIAGNOSIS — I10 Essential (primary) hypertension: Secondary | ICD-10-CM

## 2012-03-10 DIAGNOSIS — R739 Hyperglycemia, unspecified: Secondary | ICD-10-CM | POA: Insufficient documentation

## 2012-03-10 LAB — HEMOGLOBIN A1C: Hgb A1c MFr Bld: 5 % (ref 4.6–6.5)

## 2012-03-10 LAB — URINALYSIS, ROUTINE W REFLEX MICROSCOPIC
Bilirubin Urine: NEGATIVE
Urine Glucose: NEGATIVE

## 2012-03-10 LAB — CBC WITH DIFFERENTIAL/PLATELET
Basophils Relative: 0.3 % (ref 0.0–3.0)
Eosinophils Absolute: 0.1 10*3/uL (ref 0.0–0.7)
Eosinophils Relative: 1.4 % (ref 0.0–5.0)
Lymphocytes Relative: 42 % (ref 12.0–46.0)
MCHC: 33.3 g/dL (ref 30.0–36.0)
Neutrophils Relative %: 44.3 % (ref 43.0–77.0)
RBC: 4.47 Mil/uL (ref 4.22–5.81)
WBC: 4.7 10*3/uL (ref 4.5–10.5)

## 2012-03-10 LAB — COMPREHENSIVE METABOLIC PANEL
Albumin: 4.1 g/dL (ref 3.5–5.2)
BUN: 12 mg/dL (ref 6–23)
CO2: 27 mEq/L (ref 19–32)
Calcium: 9 mg/dL (ref 8.4–10.5)
Chloride: 103 mEq/L (ref 96–112)
Glucose, Bld: 122 mg/dL — ABNORMAL HIGH (ref 70–99)
Potassium: 3.6 mEq/L (ref 3.5–5.1)

## 2012-03-10 LAB — LIPID PANEL: Cholesterol: 104 mg/dL (ref 0–200)

## 2012-03-10 NOTE — Patient Instructions (Addendum)
Health Maintenance, Males A healthy lifestyle and preventative care can promote health and wellness.  Maintain regular health, dental, and eye exams.  Eat a healthy diet. Foods like vegetables, fruits, whole grains, low-fat dairy products, and lean protein foods contain the nutrients you need without too many calories. Decrease your intake of foods high in solid fats, added sugars, and salt. Get information about a proper diet from your caregiver, if necessary.  Regular physical exercise is one of the most important things you can do for your health. Most adults should get at least 150 minutes of moderate-intensity exercise (any activity that increases your heart rate and causes you to sweat) each week. In addition, most adults need muscle-strengthening exercises on 2 or more days a week.   Maintain a healthy weight. The body mass index (BMI) is a screening tool to identify possible weight problems. It provides an estimate of body fat based on height and weight. Your caregiver can help determine your BMI, and can help you achieve or maintain a healthy weight. For adults 20 years and older:  A BMI below 18.5 is considered underweight.  A BMI of 18.5 to 24.9 is normal.  A BMI of 25 to 29.9 is considered overweight.  A BMI of 30 and above is considered obese.  Maintain normal blood lipids and cholesterol by exercising and minimizing your intake of saturated fat. Eat a balanced diet with plenty of fruits and vegetables. Blood tests for lipids and cholesterol should begin at age 59 and be repeated every 5 years. If your lipid or cholesterol levels are high, you are over 50, or you are a high risk for heart disease, you may need your cholesterol levels checked more frequently.Ongoing high lipid and cholesterol levels should be treated with medicines, if diet and exercise are not effective.  If you smoke, find out from your caregiver how to quit. If you do not use tobacco, do not start.  If you  choose to drink alcohol, do not exceed 2 drinks per day. One drink is considered to be 12 ounces (355 mL) of beer, 5 ounces (148 mL) of wine, or 1.5 ounces (44 mL) of liquor.  Avoid use of street drugs. Do not share needles with anyone. Ask for help if you need support or instructions about stopping the use of drugs.  High blood pressure causes heart disease and increases the risk of stroke. Blood pressure should be checked at least every 1 to 2 years. Ongoing high blood pressure should be treated with medicines if weight loss and exercise are not effective.  If you are 3 to 53 years old, ask your caregiver if you should take aspirin to prevent heart disease.  Diabetes screening involves taking a blood sample to check your fasting blood sugar level. This should be done once every 3 years, after age 50, if you are within normal weight and without risk factors for diabetes. Testing should be considered at a younger age or be carried out more frequently if you are overweight and have at least 1 risk factor for diabetes.  Colorectal cancer can be detected and often prevented. Most routine colorectal cancer screening begins at the age of 28 and continues through age 84. However, your caregiver may recommend screening at an earlier age if you have risk factors for colon cancer. On a yearly basis, your caregiver may provide home test kits to check for hidden blood in the stool. Use of a small camera at the end of a tube,  to directly examine the colon (sigmoidoscopy or colonoscopy), can detect the earliest forms of colorectal cancer. Talk to your caregiver about this at age 33, when routine screening begins. Direct examination of the colon should be repeated every 5 to 10 years through age 8, unless early forms of pre-cancerous polyps or small growths are found.  Hepatitis C blood testing is recommended for all people born from [birth year] through 1966 and any individual with known risks for hepatitis C.  Healthy  men should no longer receive prostate-specific antigen (PSA) blood tests as part of routine cancer screening. Consult with your caregiver about prostate cancer screening.  Testicular cancer screening is not recommended for adolescents or adult males who have no symptoms. Screening includes self-exam, caregiver exam, and other screening tests. Consult with your caregiver about any symptoms you have or any concerns you have about testicular cancer.  Practice safe sex. Use condoms and avoid high-risk sexual practices to reduce the spread of sexually transmitted infections (STIs).  Use sunscreen with a sun protection factor (SPF) of 30 or greater. Apply sunscreen liberally and repeatedly throughout the day. You should seek shade when your shadow is shorter than you. Protect yourself by wearing long sleeves, pants, a wide-brimmed hat, and sunglasses year round, whenever you are outdoors.  Notify your caregiver of new moles or changes in moles, especially if there is a change in shape or color. Also notify your caregiver if a mole is larger than the size of a pencil eraser.  A one-time screening for abdominal aortic aneurysm (AAA) and surgical repair of large AAAs by sound wave imaging (ultrasonography) is recommended for ages 45 to 57 years who are current or former smokers.  Stay current with your immunizations. Document Released: 08/10/2007 Document Revised: 05/06/2011 Document Reviewed: 07/09/2010 [facility] Patient Information 2013 [place]. Hypertension As your heart beats, it forces blood through your arteries. This force is your blood pressure. If the pressure is too high, it is called hypertension (HTN) or high blood pressure. HTN is dangerous because you may have it and not know it. High blood pressure may mean that your heart has to work harder to pump blood. Your arteries may be narrow or stiff. The extra work puts you at risk for heart disease, stroke, and other problems.  Blood pressure  consists of two numbers, a higher number over a lower, 110/72, for example. It is stated as "110 over 72." The ideal is below 120 for the top number (systolic) and under 80 for the bottom (diastolic). Write down your blood pressure today. You should pay close attention to your blood pressure if you have certain conditions such as:  Heart failure.  Prior heart attack.  Diabetes  Chronic kidney disease.  Prior stroke.  Multiple risk factors for heart disease. To see if you have HTN, your blood pressure should be measured while you are seated with your arm held at the level of the heart. It should be measured at least twice. A one-time elevated blood pressure reading (especially in the Emergency Department) does not mean that you need treatment. There may be conditions in which the blood pressure is different between your right and left arms. It is important to see your caregiver soon for a recheck. Most people have essential hypertension which means that there is not a specific cause. This type of high blood pressure may be lowered by changing lifestyle factors such as:  Stress.  Smoking.  Lack of exercise.  Excessive weight.  Drug/tobacco/alcohol use.  Eating less salt. Most people do not have symptoms from high blood pressure until it has caused damage to the body. Effective treatment can often prevent, delay or reduce that damage. TREATMENT  When a cause has been identified, treatment for high blood pressure is directed at the cause. There are a large number of medications to treat HTN. These fall into several categories, and your caregiver will help you select the medicines that are best for you. Medications may have side effects. You should review side effects with your caregiver. If your blood pressure stays high after you have made lifestyle changes or started on medicines,   Your medication(s) may need to be changed.  Other problems may need to be addressed.  Be certain you  understand your prescriptions, and know how and when to take your medicine.  Be sure to follow up with your caregiver within the time frame advised (usually within two weeks) to have your blood pressure rechecked and to review your medications.  If you are taking more than one medicine to lower your blood pressure, make sure you know how and at what times they should be taken. Taking two medicines at the same time can result in blood pressure that is too low. SEEK IMMEDIATE MEDICAL CARE IF:  You develop a severe headache, blurred or changing vision, or confusion.  You have unusual weakness or numbness, or a faint feeling.  You have severe chest or abdominal pain, vomiting, or breathing problems. MAKE SURE YOU:   Understand these instructions.  Will watch your condition.  Will get help right away if you are not doing well or get worse. Document Released: 02/11/2005 Document Revised: 05/06/2011 Document Reviewed: 10/02/2007 [facility] Patient Information 2013 [place].

## 2012-03-10 NOTE — Progress Notes (Signed)
Subjective:    Patient ID: Brad Hayes, male    DOB: Sep 29, 1960, 52 y.o.   MRN: [id number]  Hypertension This is a chronic problem. The problem has been gradually improving since onset. The problem is controlled. Pertinent negatives include no anxiety, blurred vision, chest pain, headaches, malaise/fatigue, neck pain, orthopnea, palpitations, peripheral edema, PND, shortness of breath or sweats. Agents associated with hypertension include NSAIDs. Past treatments include angiotensin blockers and calcium channel blockers. The current treatment provides significant improvement. There are no compliance problems.  Identifiable causes of hypertension include sleep apnea.      Review of Systems  Constitutional: Negative for fever, chills, malaise/fatigue, diaphoresis, activity change, appetite change, fatigue and unexpected weight change.  HENT: Negative.  Negative for neck pain.   Eyes: Negative.  Negative for blurred vision.  Respiratory: Positive for apnea. Negative for cough, choking, chest tightness, shortness of breath, wheezing and stridor.   Cardiovascular: Negative for chest pain, palpitations, orthopnea, leg swelling and PND.  Gastrointestinal: Negative.   Genitourinary: Negative for dysuria, frequency, hematuria, enuresis and difficulty urinating.  Musculoskeletal: Negative.   Skin: Negative for color change, pallor, rash and wound.  Neurological: Negative for dizziness, tremors, seizures, syncope, facial asymmetry, speech difficulty, weakness, light-headedness, numbness and headaches.  Hematological: Negative for adenopathy. Does not bruise/bleed easily.  Psychiatric/Behavioral: Negative.        Objective:   Physical Exam  Vitals reviewed. Constitutional: He is oriented to person, place, and time. He appears well-developed and well-nourished. No distress.  HENT:  Head: Normocephalic and atraumatic.  Mouth/Throat: Oropharynx is clear and moist. No oropharyngeal exudate.    Eyes: Conjunctivae normal are normal. Right eye exhibits no discharge. Left eye exhibits no discharge. No scleral icterus.  Neck: Normal range of motion. Neck supple. No JVD present. No tracheal deviation present. No thyromegaly present.  Cardiovascular: Normal rate, regular rhythm, normal heart sounds and intact distal pulses.  Exam reveals no gallop and no friction rub.   No murmur heard. Pulmonary/Chest: Breath sounds normal. No stridor. No respiratory distress. He has no wheezes. He has no rales. He exhibits no tenderness.  Abdominal: Soft. Bowel sounds are normal. He exhibits no distension and no mass. There is no tenderness. There is no rebound and no guarding. Hernia confirmed negative in the right inguinal area and confirmed negative in the left inguinal area.  Genitourinary: Prostate normal, testes normal and penis normal. Rectal exam shows no external hemorrhoid, no internal hemorrhoid, no fissure, no mass, no tenderness and anal tone normal. Guaiac negative stool. Prostate is not enlarged and not tender. Right testis shows no mass, no swelling and no tenderness. Right testis is descended. Left testis shows no mass, no swelling and no tenderness. Left testis is descended. Circumcised. No penile erythema or penile tenderness. No discharge found.  Musculoskeletal: Normal range of motion. He exhibits no edema and no tenderness.  Lymphadenopathy:    He has no cervical adenopathy.       Right: No inguinal adenopathy present.       Left: No inguinal adenopathy present.  Neurological: He is oriented to person, place, and time.  Skin: Skin is warm and dry. No rash noted. He is not diaphoretic. No erythema. No pallor.  Psychiatric: He has a normal mood and affect. His behavior is normal. Judgment and thought content normal.     Lab Results  Component Value Date   WBC 5.6 03/21/2010   HGB 16.3 11/04/2010   HCT 48.0 11/04/2010   PLT 251.0 03/21/2010  GLUCOSE 100* 11/04/2010   CHOL 121 03/21/2010    TRIG 102.0 03/21/2010   HDL 37.00* 03/21/2010   LDLCALC 64 03/21/2010   ALT 40 03/21/2010   AST 31 03/21/2010   NA 138 11/04/2010   K 4.2 11/04/2010   CL 103 11/04/2010   CREATININE 1.30 11/04/2010   BUN 15 11/04/2010   CO2 28 03/21/2010   TSH 1.53 03/21/2010   PSA 0.75 03/21/2010       Assessment & Plan:

## 2012-03-12 ENCOUNTER — Encounter: Payer: Self-pay | Admitting: Internal Medicine

## 2012-03-12 NOTE — Assessment & Plan Note (Signed)
EKG looks normal today and he has no s/s - no further evaluation for now

## 2012-03-12 NOTE — Assessment & Plan Note (Signed)
Exam done Vaccines were reviewed He was referred for a colonoscopy Labs ordered Pt ed material was given

## 2012-03-12 NOTE — Assessment & Plan Note (Signed)
His BP is well controlled I will check his lytes and renal function 

## 2012-03-12 NOTE — Assessment & Plan Note (Deleted)
BP is well controlled I will check his lytes and renal function

## 2012-03-12 NOTE — Assessment & Plan Note (Signed)
I will check his a1c to see if he has developed DM II

## 2012-04-16 ENCOUNTER — Telehealth: Payer: Self-pay | Admitting: Internal Medicine

## 2012-04-16 NOTE — Telephone Encounter (Signed)
Requesting samples of BP meds

## 2012-04-28 ENCOUNTER — Ambulatory Visit (INDEPENDENT_AMBULATORY_CARE_PROVIDER_SITE_OTHER)
Admission: RE | Admit: 2012-04-28 | Discharge: 2012-04-28 | Disposition: A | Discharge status: Home / Self Care | Payer: BC Managed Care – PPO | Source: Ambulatory Visit | Attending: Internal Medicine | Admitting: Internal Medicine

## 2012-04-28 ENCOUNTER — Encounter: Payer: Self-pay | Admitting: Internal Medicine

## 2012-04-28 ENCOUNTER — Ambulatory Visit (INDEPENDENT_AMBULATORY_CARE_PROVIDER_SITE_OTHER): Payer: BC Managed Care – PPO | Admitting: Internal Medicine

## 2012-04-28 VITALS — BP 130/84 | HR 80 | Temp 98.2°F | Resp 16 | Wt 239.0 lb

## 2012-04-28 DIAGNOSIS — M545 Low back pain, unspecified: Secondary | ICD-10-CM

## 2012-04-28 DIAGNOSIS — I10 Essential (primary) hypertension: Secondary | ICD-10-CM

## 2012-04-28 MED ORDER — HYDROCODONE-ACETAMINOPHEN 5-325 MG PO TABS: 1.0000 | ORAL_TABLET | Freq: Four times a day (QID) | ORAL | Status: DC | PRN

## 2012-04-28 NOTE — Patient Instructions (Signed)
Back Pain, Adult Low back pain is very common. About 1 in 5 people have back pain.The cause of low back pain is rarely dangerous. The pain often gets better over time.About half of people with a sudden onset of back pain feel better in just 2 weeks. About 8 in 10 people feel better by 6 weeks.  CAUSES Some common causes of back pain include:  Strain of the muscles or ligaments supporting the spine.  Wear and tear (degeneration) of the spinal discs.  Arthritis.  Direct injury to the back. DIAGNOSIS Most of the time, the direct cause of low back pain is not known.However, back pain can be treated effectively even when the exact cause of the pain is unknown.Answering your caregiver's questions about your overall health and symptoms is one of the most accurate ways to make sure the cause of your pain is not dangerous. If your caregiver needs more information, he or she may order lab work or imaging tests (X-rays or MRIs).However, even if imaging tests show changes in your back, this usually does not require surgery. HOME CARE INSTRUCTIONS For many people, back pain returns.Since low back pain is rarely dangerous, it is often a condition that people can learn to [facility] their own.   Remain active. It is stressful on the back to sit or stand in one place. Do not sit, drive, or stand in one place for more than 30 minutes at a time. Take short walks on level surfaces as soon as pain allows.Try to increase the length of time you walk each day.  Do not stay in bed.Resting more than 1 or 2 days can delay your recovery.  Do not avoid exercise or work.Your body is made to move.It is not dangerous to be active, even though your back may hurt.Your back will likely heal faster if you return to being active before your pain is gone.  Pay attention to your body when you bend and lift. Many people have less discomfortwhen lifting if they bend their knees, keep the load close to their bodies,and  avoid twisting. Often, the most comfortable positions are those that put less stress on your recovering back.  Find a comfortable position to sleep. Use a firm mattress and lie on your side with your knees slightly bent. If you lie on your back, put a pillow under your knees.  Only take over-the-counter or prescription medicines as directed by your caregiver. Over-the-counter medicines to reduce pain and inflammation are often the most helpful.Your caregiver may prescribe muscle relaxant drugs.These medicines help dull your pain so you can more quickly return to your normal activities and healthy exercise.  Put ice on the injured area.  Put ice in a plastic bag.  Place a towel between your skin and the bag.  Leave the ice on for 15 to 20 minutes, 3 to 4 times a day for the first 2 to 3 days. After that, ice and heat may be alternated to reduce pain and spasms.  Ask your caregiver about trying back exercises and gentle massage. This may be of some benefit.  Avoid feeling anxious or stressed.Stress increases muscle tension and can worsen back pain.It is important to recognize when you are anxious or stressed and learn ways to manage it.Exercise is a great option. SEEK MEDICAL CARE IF:  You have pain that is not relieved with rest or medicine.  You have pain that does not improve in 1 week.  You have new symptoms.  You are generally  not feeling well. SEEK IMMEDIATE MEDICAL CARE IF:   You have pain that radiates from your back into your legs.  You develop new bowel or bladder control problems.  You have unusual weakness or numbness in your arms or legs.  You develop nausea or vomiting.  You develop abdominal pain.  You feel faint. Document Released: 02/11/2005 Document Revised: 08/13/2011 Document Reviewed: 07/02/2010 [facility] Patient Information 2013 [place].

## 2012-04-29 ENCOUNTER — Telehealth: Payer: Self-pay | Admitting: *Deleted

## 2012-04-29 NOTE — Telephone Encounter (Signed)
It was normal

## 2012-04-29 NOTE — Progress Notes (Signed)
Subjective:    Patient ID: Brad Hayes, male    DOB: 1960/11/15, 52 y.o.   MRN: [id number]  Back Pain This is a new problem. The current episode started in the past 7 days. The problem occurs constantly. The problem is unchanged. The pain is present in the lumbar spine. The pain radiates to the left thigh and right thigh. The pain is at a severity of 3/10. The pain is severe. The pain is worse during the day. The symptoms are aggravated by bending and standing. Associated symptoms include leg pain. Pertinent negatives include no abdominal pain, bladder incontinence, bowel incontinence, chest pain, dysuria, fever, headaches, numbness, paresis, paresthesias, pelvic pain, perianal numbness, tingling, weakness or weight loss. Risk factors include obesity and lack of exercise. He has tried NSAIDs for the symptoms. The treatment provided moderate relief.      Review of Systems  Constitutional: Negative.  Negative for fever and weight loss.  HENT: Negative.   Eyes: Negative.   Respiratory: Negative.   Cardiovascular: Negative.  Negative for chest pain.  Gastrointestinal: Negative.  Negative for abdominal pain and bowel incontinence.  Endocrine: Negative.   Genitourinary: Negative for bladder incontinence, dysuria and pelvic pain.  Musculoskeletal: Positive for back pain. Negative for myalgias, joint swelling and gait problem.  Skin: Negative.   Allergic/Immunologic: Negative.   Neurological: Negative.  Negative for dizziness, tingling, tremors, weakness, numbness, headaches and paresthesias.  Hematological: Negative for adenopathy. Does not bruise/bleed easily.  Psychiatric/Behavioral: Negative.        Objective:   Physical Exam  Vitals reviewed. Constitutional: He is oriented to person, place, and time. He appears well-developed and well-nourished.  Non-toxic appearance. He does not have a sickly appearance. He does not appear ill. No distress.  HENT:  Head: Normocephalic and  atraumatic.  Mouth/Throat: Oropharynx is clear and moist. No oropharyngeal exudate.  Eyes: Conjunctivae are normal. Right eye exhibits no discharge. Left eye exhibits no discharge. No scleral icterus.  Neck: Normal range of motion. Neck supple. No JVD present. No tracheal deviation present. No thyromegaly present.  Cardiovascular: Normal rate, regular rhythm, normal heart sounds and intact distal pulses.  Exam reveals no gallop and no friction rub.   No murmur heard. Pulmonary/Chest: Effort normal and breath sounds normal. No stridor. No respiratory distress. He has no wheezes. He has no rales. He exhibits no tenderness.  Abdominal: Soft. Bowel sounds are normal. He exhibits no distension and no mass. There is no tenderness. There is no rebound and no guarding.  Musculoskeletal: Normal range of motion. He exhibits no edema and no tenderness.       Lumbar back: Normal. He exhibits normal range of motion, no tenderness and no bony tenderness.  Lymphadenopathy:    He has no cervical adenopathy.  Neurological: He is alert and oriented to person, place, and time. He has normal strength. He displays no atrophy, no tremor and normal reflexes. No cranial nerve deficit or sensory deficit. He exhibits normal muscle tone. He displays a negative Romberg sign. He displays no seizure activity. Coordination and gait normal.  Reflex Scores:      Tricep reflexes are 1+ on the right side and 1+ on the left side.      Bicep reflexes are 1+ on the right side and 1+ on the left side.      Brachioradialis reflexes are 1+ on the right side and 1+ on the left side.      Patellar reflexes are 1+ on the right side and  1+ on the left side.      Achilles reflexes are 1+ on the right side and 1+ on the left side. -SLR in BLE  Skin: Skin is warm and dry. No rash noted. He is not diaphoretic. No erythema. No pallor.  Psychiatric: He has a normal mood and affect. His behavior is normal. Judgment and thought content normal.           Assessment & Plan:

## 2012-04-29 NOTE — Telephone Encounter (Signed)
PATIENT CALLED REQUEST RESULTS OF HIS BACK X=RAY. CB# [phone].

## 2012-04-29 NOTE — Telephone Encounter (Signed)
PATIENT NOTIFIED OF NORMAL BACK X-RAY.

## 2012-04-29 NOTE — Assessment & Plan Note (Signed)
His BP is well controlled 

## 2012-04-29 NOTE — Assessment & Plan Note (Signed)
He will continue nsaids for the pain Will add norco for additional pain relief I will check his plain films today and will advise further if needed

## 2012-05-25 ENCOUNTER — Telehealth: Payer: Self-pay | Admitting: Internal Medicine

## 2012-05-25 NOTE — Telephone Encounter (Signed)
Requesting samples of Azor.  Please call when ready.

## 2012-05-25 NOTE — Telephone Encounter (Signed)
Called pt, told by minor tha the was not home. Samples placed upfront and closing phone note

## 2012-09-02 ENCOUNTER — Telehealth: Payer: Self-pay | Admitting: *Deleted

## 2012-09-02 NOTE — Telephone Encounter (Signed)
Pt called requesting refill samples on Azor.  Advised pt medications ready for pick up.

## 2012-09-28 ENCOUNTER — Ambulatory Visit (INDEPENDENT_AMBULATORY_CARE_PROVIDER_SITE_OTHER): Payer: BC Managed Care – PPO | Admitting: Internal Medicine

## 2012-09-28 ENCOUNTER — Encounter: Payer: Self-pay | Admitting: Internal Medicine

## 2012-09-28 VITALS — BP 133/82 | HR 82 | Temp 98.4°F | Wt 219.6 lb

## 2012-09-28 DIAGNOSIS — J309 Allergic rhinitis, unspecified: Secondary | ICD-10-CM

## 2012-09-28 MED ORDER — PREDNISONE 10 MG PO TABS: ORAL_TABLET | ORAL | Status: DC

## 2012-09-28 NOTE — Patient Instructions (Signed)
Allergic Rhinitis Allergic rhinitis is when the mucous membranes in the nose respond to allergens. Allergens are particles in the air that cause your body to have an allergic reaction. This causes you to release allergic antibodies. Through a chain of events, these eventually cause you to release histamine into the blood stream (hence the use of antihistamines). Although meant to be protective to the body, it is this release that causes your discomfort, such as frequent sneezing, congestion and an itchy runny nose.  CAUSES  The pollen allergens may come from grasses, trees, and weeds. This is seasonal allergic rhinitis, or "hay fever." Other allergens cause year-round allergic rhinitis (perennial allergic rhinitis) such as house dust mite allergen, pet dander and mold spores.  SYMPTOMS   Nasal stuffiness (congestion).  Runny, itchy nose with sneezing and tearing of the eyes.  There is often an itching of the mouth, eyes and ears. It cannot be cured, but it can be controlled with medications. DIAGNOSIS  If you are unable to determine the offending allergen, skin or blood testing may find it. TREATMENT   Avoid the allergen.  Medications and allergy shots (immunotherapy) can help.  Hay fever may often be treated with antihistamines in pill or nasal spray forms. Antihistamines block the effects of histamine. There are over-the-counter medicines that may help with nasal congestion and swelling around the eyes. Check with your caregiver before taking or giving this medicine. If the treatment above does not work, there are many new medications your caregiver can prescribe. Stronger medications may be used if initial measures are ineffective. Desensitizing injections can be used if medications and avoidance fails. Desensitization is when a patient is given ongoing shots until the body becomes less sensitive to the allergen. Make sure you follow up with your caregiver if problems continue. SEEK MEDICAL  CARE IF:   You develop fever (more than 100.5 F (38.1 C).  You develop a cough that does not stop easily (persistent).  You have shortness of breath.  You start wheezing.  Symptoms interfere with normal daily activities. Document Released: 11/06/2000 Document Revised: 05/06/2011 Document Reviewed: 05/18/2008 [facility] Patient Information 2014 [place].

## 2012-09-28 NOTE — Progress Notes (Signed)
HPI  Pt presents to the clinic today with c/o sneezing, throat drainage, headache, nausea and weakness. This started 4 days ago but seem to have gotten worse over the weekend. He denies fever, chills or body aches. He has taken Mucinex and Sudafed which has not helped all that much. He has not history of allergies or asthma. He has not had sick contacts.  Review of Systems    Past Medical History  Diagnosis Date  . Hypertension   . OSA (obstructive sleep apnea)   . Chronic insomnia     Family History  Problem Relation Age of Onset  . Hypertension Mother   . Sickle cell anemia Mother   . Alcohol abuse Father   . Hypertension Sister   . Hypertension Brother     History   Social History  . Marital Status: Married    Spouse Name: N/A    Number of Children: N/A  . Years of Education: N/A   Occupational History  . Not on file.   Social History Main Topics  . Smoking status: Never Smoker   . Smokeless tobacco: Never Used  . Alcohol Use: 3.0 oz/week    5 Cans of beer per week  . Drug Use: No  . Sexually Active: Yes   Other Topics Concern  . Not on file   Social History Narrative  . No narrative on file    No Known Allergies   Constitutional: Positive headache, fatigue. Denies fever or abrupt weight changes.  HEENT:  Positive eye pain, pressure behind the eyes. Denies eye redness, ear pain, ringing in the ears, wax buildup, runny nose or bloody nose. Respiratory:  Denies cough,  difficulty breathing or shortness of breath.  Cardiovascular: Denies chest pain, chest tightness, palpitations or swelling in the hands or feet.   No other specific complaints in a complete review of systems (except as listed in HPI above).  Objective:    BP 133/82  Pulse 82  Temp(Src) 98.4 F (36.9 C) (Oral)  Wt 219 lb 9.6 oz (99.61 kg)  BMI 29.78 kg/m2  SpO2 95% Wt Readings from Last 3 Encounters:  09/28/12 219 lb 9.6 oz (99.61 kg)  04/28/12 239 lb (108.41 kg)  03/10/12 242 lb  (109.77 kg)    General: Appears his stated age, well developed, well nourished in NAD. HEENT: Head: normal shape and size; Eyes: sclera white, no icterus, conjunctiva pink, PERRLA and EOMs intact; Ears: Tm's gray and intact, normal light reflex; Nose: mucosa pink and moist, septum midline; Throat/Mouth: + PND. Teeth present, mucosa pink and moist, no exudate noted, no lesions or ulcerations noted.  Neck: Mild cervical lymphadenopathy. Neck supple, trachea midline. No massses, lumps or thyromegaly present.  Cardiovascular: Normal rate and rhythm. S1,S2 noted.  No murmur, rubs or gallops noted. No JVD or BLE edema. No carotid bruits noted. Pulmonary/Chest: Normal effort and positive vesicular breath sounds. No respiratory distress. No wheezes, rales or ronchi noted.      Assessment & Plan:   Allergic Rhinitis, new onset:  Can use a Neti Pot which can be purchased from your local drug store. Take Zyrtec OTC eRx for pred taper   RTC as needed or if symptoms persist.

## 2012-11-25 ENCOUNTER — Telehealth: Payer: Self-pay | Admitting: Internal Medicine

## 2012-11-25 DIAGNOSIS — I10 Essential (primary) hypertension: Secondary | ICD-10-CM

## 2012-11-25 NOTE — Telephone Encounter (Signed)
Pt request sample for Azor 10-40 mg. Pt stated he does not a phone that is working right now, so he will stop by later on today if its possible.

## 2012-11-26 MED ORDER — AMLODIPINE-OLMESARTAN 10-40 MG PO TABS: 1.0000 | ORAL_TABLET | Freq: Every day | ORAL | Status: DC

## 2013-01-06 ENCOUNTER — Telehealth: Payer: Self-pay | Admitting: *Deleted

## 2013-01-06 ENCOUNTER — Ambulatory Visit: Payer: BC Managed Care – PPO | Admitting: Internal Medicine

## 2013-01-06 NOTE — Telephone Encounter (Signed)
To[facility]helba Flake Fax: [phone] From: [facility] Date/ Time: 01/05/2013 10:48 PM Taken By: Fransisca Connors, CSR Caller: [place]: not collected Patient: Brad Hayes DOB: Mar 01, 1960 Phone: [phone] Reason for Call: See info below Regarding Appointment: Yes Appt Date: 01/06/2013 Appt Time: 8:00:00 AM Provider: Scarlette Calico (Adults only) Reason: Details: calling to cancel appt for tomorrow Outcome: Cancelled appointment in EP[facility])

## 2013-03-03 ENCOUNTER — Encounter: Payer: Self-pay | Admitting: Internal Medicine

## 2013-03-03 ENCOUNTER — Ambulatory Visit (INDEPENDENT_AMBULATORY_CARE_PROVIDER_SITE_OTHER): Payer: BC Managed Care – PPO | Admitting: Internal Medicine

## 2013-03-03 VITALS — BP 120/88 | HR 69 | Temp 97.7°F | Resp 16 | Ht 72.0 in | Wt 218.0 lb

## 2013-03-03 DIAGNOSIS — R7309 Other abnormal glucose: Secondary | ICD-10-CM

## 2013-03-03 DIAGNOSIS — I10 Essential (primary) hypertension: Secondary | ICD-10-CM

## 2013-03-03 DIAGNOSIS — F528 Other sexual dysfunction not due to a substance or known physiological condition: Secondary | ICD-10-CM

## 2013-03-03 DIAGNOSIS — Z0000 Encounter for general adult medical examination without abnormal findings: Secondary | ICD-10-CM

## 2013-03-03 MED ORDER — TADALAFIL 20 MG PO TABS: 20.0000 mg | ORAL_TABLET | Freq: Every day | ORAL | Status: DC | PRN

## 2013-03-03 MED ORDER — AMLODIPINE-OLMESARTAN 10-40 MG PO TABS: 1.0000 | ORAL_TABLET | Freq: Every day | ORAL | Status: DC

## 2013-03-03 NOTE — Progress Notes (Signed)
Pre visit review using our clinic review tool, if applicable. No additional management support is needed unless otherwise documented below in the visit note. 

## 2013-03-03 NOTE — Patient Instructions (Signed)

## 2013-03-03 NOTE — Progress Notes (Signed)
   Subjective:    Patient ID: Brad Hayes, male    DOB: 1960/03/05, 53 y.o.   MRN: [id number]  Hypertension This is a chronic problem. The current episode started more than 1 year ago. The problem has been gradually improving since onset. The problem is controlled. Pertinent negatives include no anxiety, blurred vision, chest pain, headaches, malaise/fatigue, neck pain, orthopnea, palpitations, peripheral edema, PND, shortness of breath or sweats. Past treatments include angiotensin blockers and calcium channel blockers. The current treatment provides moderate improvement. Compliance problems include exercise and diet.       Review of Systems  Constitutional: Negative.  Negative for fever, chills, malaise/fatigue, diaphoresis, appetite change and fatigue.  HENT: Negative.   Eyes: Negative.  Negative for blurred vision.  Respiratory: Negative.  Negative for cough, choking, chest tightness, shortness of breath, wheezing and stridor.   Cardiovascular: Negative.  Negative for chest pain, palpitations, orthopnea, leg swelling and PND.  Gastrointestinal: Negative.  Negative for nausea, vomiting, abdominal pain, diarrhea, constipation and blood in stool.  Endocrine: Negative.   Genitourinary: Negative.   Musculoskeletal: Negative.  Negative for neck pain.  Skin: Negative.   Allergic/Immunologic: Negative.   Neurological: Negative.  Negative for dizziness, tremors, syncope, facial asymmetry, weakness, light-headedness and headaches.  Hematological: Negative.  Negative for adenopathy. Does not bruise/bleed easily.  Psychiatric/Behavioral: Negative.        Objective:   Physical Exam  Vitals reviewed. Constitutional: He is oriented to person, place, and time. He appears well-developed and well-nourished. No distress.  HENT:  Head: Normocephalic and atraumatic.  Mouth/Throat: Oropharynx is clear and moist. No oropharyngeal exudate.  Eyes: Conjunctivae are normal. Right eye exhibits no  discharge. Left eye exhibits no discharge. No scleral icterus.  Neck: Normal range of motion. Neck supple. No JVD present. No tracheal deviation present. No thyromegaly present.  Cardiovascular: Normal rate, regular rhythm, normal heart sounds and intact distal pulses.  Exam reveals no gallop and no friction rub.   No murmur heard. Pulmonary/Chest: Effort normal and breath sounds normal. No stridor. No respiratory distress. He has no wheezes. He has no rales. He exhibits no tenderness.  Abdominal: Soft. Bowel sounds are normal. He exhibits no distension and no mass. There is no tenderness. There is no rebound and no guarding.  Musculoskeletal: Normal range of motion. He exhibits no edema and no tenderness.  Lymphadenopathy:    He has no cervical adenopathy.  Neurological: He is oriented to person, place, and time.  Skin: Skin is warm and dry. No rash noted. He is not diaphoretic. No erythema. No pallor.  Psychiatric: He has a normal mood and affect. His behavior is normal. Judgment and thought content normal.     Lab Results  Component Value Date   WBC 4.7 03/10/2012   HGB 13.1 03/10/2012   HCT 39.3 03/10/2012   PLT 227.0 03/10/2012   GLUCOSE 122* 03/10/2012   CHOL 104 03/10/2012   TRIG 153.0* 03/10/2012   HDL 33.20* 03/10/2012   LDLCALC 40 03/10/2012   ALT 37 03/10/2012   AST 27 03/10/2012   NA 138 03/10/2012   K 3.6 03/10/2012   CL 103 03/10/2012   CREATININE 1.3 03/10/2012   BUN 12 03/10/2012   CO2 27 03/10/2012   TSH 1.73 03/10/2012   PSA 0.69 03/10/2012   HGBA1C 5.0 03/10/2012       Assessment & Plan:

## 2013-03-05 NOTE — Assessment & Plan Note (Signed)
His BP is well controlled 

## 2013-03-05 NOTE — Assessment & Plan Note (Signed)
He will cont cialis as needed

## 2013-03-29 ENCOUNTER — Telehealth: Payer: Self-pay | Admitting: Internal Medicine

## 2013-03-29 NOTE — Telephone Encounter (Signed)
Relevant patient education mailed to patient.  

## 2013-04-07 LAB — HM COLONOSCOPY

## 2013-05-05 ENCOUNTER — Encounter: Payer: Self-pay | Admitting: Internal Medicine

## 2013-05-31 ENCOUNTER — Other Ambulatory Visit: Payer: Self-pay | Admitting: *Deleted

## 2013-05-31 DIAGNOSIS — I10 Essential (primary) hypertension: Secondary | ICD-10-CM

## 2013-05-31 MED ORDER — AMLODIPINE-OLMESARTAN 10-40 MG PO TABS: 1.0000 | ORAL_TABLET | Freq: Every day | ORAL | Status: DC

## 2013-09-22 ENCOUNTER — Telehealth: Payer: Self-pay | Admitting: *Deleted

## 2013-09-22 DIAGNOSIS — I10 Essential (primary) hypertension: Secondary | ICD-10-CM

## 2013-09-22 MED ORDER — AMLODIPINE-OLMESARTAN 10-40 MG PO TABS: 1.0000 | ORAL_TABLET | Freq: Every day | ORAL | Status: DC

## 2013-09-22 NOTE — Telephone Encounter (Signed)
Left msg on triage requesting to pick samples up for azor 10/40 mg. Called pt back no answer LMOM we have some 5/20 mg but he will have to take 2 pills to equal his dosage. Also left msg with new sample policy as well...Brad Hayes

## 2013-11-30 ENCOUNTER — Telehealth: Payer: Self-pay | Admitting: *Deleted

## 2013-11-30 DIAGNOSIS — I10 Essential (primary) hypertension: Secondary | ICD-10-CM

## 2013-11-30 MED ORDER — AMLODIPINE-OLMESARTAN 10-40 MG PO TABS: 1.0000 | ORAL_TABLET | Freq: Every day | ORAL | Status: DC

## 2013-11-30 NOTE — Telephone Encounter (Signed)
No samples, sent Rx sent

## 2013-11-30 NOTE — Telephone Encounter (Signed)
Left msg stating md told him to call if he need samples on his azor...Johny Chess

## 2013-12-01 MED ORDER — AMLODIPINE-OLMESARTAN 10-40 MG PO TABS: 1.0000 | ORAL_TABLET | Freq: Every day | ORAL | Status: DC

## 2013-12-01 NOTE — Telephone Encounter (Signed)
Called pt spoke with wife inform her with md response. Resent med to [facility]...Johny Chess

## 2014-02-23 ENCOUNTER — Ambulatory Visit (INDEPENDENT_AMBULATORY_CARE_PROVIDER_SITE_OTHER): Payer: BC Managed Care – PPO | Admitting: Family

## 2014-02-23 DIAGNOSIS — I10 Essential (primary) hypertension: Secondary | ICD-10-CM

## 2014-02-23 DIAGNOSIS — F528 Other sexual dysfunction not due to a substance or known physiological condition: Secondary | ICD-10-CM

## 2014-02-23 MED ORDER — TADALAFIL 20 MG PO TABS: 20.0000 mg | ORAL_TABLET | Freq: Every day | ORAL | Status: DC | PRN

## 2014-02-23 MED ORDER — AMLODIPINE-OLMESARTAN 10-40 MG PO TABS: 1.0000 | ORAL_TABLET | Freq: Every day | ORAL | Status: DC

## 2014-02-23 NOTE — Assessment & Plan Note (Signed)
Medication refilled

## 2014-02-23 NOTE — Patient Instructions (Signed)
Please restart taking her blood pressure medications  May restart Cialis as needed  Thank you for choosing [facility].  Summary/Instructions:  Your prescription(s) have been submitted to your pharmacy. Please take as directed and contact our office if you believe you are having problem(s) with the medication(s).  If your symptoms worsen or fail to improve, please contact our office for further instruction, or in case of emergency go directly to the emergency room at the closest medical facility.   Hypertension Hypertension, commonly called high blood pressure, is when the force of blood pumping through your arteries is too strong. Your arteries are the blood vessels that carry blood from your heart throughout your body. A blood pressure reading consists of a higher number over a lower number, such as 110/72. The higher number (systolic) is the pressure inside your arteries when your heart pumps. The lower number (diastolic) is the pressure inside your arteries when your heart relaxes. Ideally you want your blood pressure below 120/80. Hypertension forces your heart to work harder to pump blood. Your arteries may become narrow or stiff. Having hypertension puts you at risk for heart disease, stroke, and other problems.  RISK FACTORS Some risk factors for high blood pressure are controllable. Others are not.  Risk factors you cannot control include:   Race. You may be at higher risk if you are African American.  Age. Risk increases with age.  Gender. Men are at higher risk than women before age 73 years. After age 31, women are at higher risk than men. Risk factors you can control include:  Not getting enough exercise or physical activity.  Being overweight.  Getting too much fat, sugar, calories, or salt in your diet.  Drinking too much alcohol. SIGNS AND SYMPTOMS Hypertension does not usually cause signs or symptoms. Extremely high blood pressure (hypertensive crisis) may  cause headache, anxiety, shortness of breath, and nosebleed. DIAGNOSIS  To check if you have hypertension, your health care provider will measure your blood pressure while you are seated, with your arm held at the level of your heart. It should be measured at least twice using the same arm. Certain conditions can cause a difference in blood pressure between your right and left arms. A blood pressure reading that is higher than normal on one occasion does not mean that you need treatment. If one blood pressure reading is high, ask your health care provider about having it checked again. TREATMENT  Treating high blood pressure includes making lifestyle changes and possibly taking medicine. Living a healthy lifestyle can help lower high blood pressure. You may need to change some of your habits. Lifestyle changes may include:  Following the DASH diet. This diet is high in fruits, vegetables, and whole grains. It is low in salt, red meat, and added sugars.  Getting at least 2 hours of brisk physical activity every week.  Losing weight if necessary.  Not smoking.  Limiting alcoholic beverages.  Learning ways to reduce stress. If lifestyle changes are not enough to get your blood pressure under control, your health care provider may prescribe medicine. You may need to take more than one. Work closely with your health care provider to understand the risks and benefits. HOME CARE INSTRUCTIONS  Have your blood pressure rechecked as directed by your health care provider.   Take medicines only as directed by your health care provider. Follow the directions carefully. Blood pressure medicines must be taken as prescribed. The medicine does not work as well when  you skip doses. Skipping doses also puts you at risk for problems.   Do not smoke.   Monitor your blood pressure at home as directed by your health care provider. SEEK MEDICAL CARE IF:   You think you are having a reaction to medicines  taken.  You have recurrent headaches or feel dizzy.  You have swelling in your ankles.  You have trouble with your vision. SEEK IMMEDIATE MEDICAL CARE IF:  You develop a severe headache or confusion.  You have unusual weakness, numbness, or feel faint.  You have severe chest or abdominal pain.  You vomit repeatedly.  You have trouble breathing. MAKE SURE YOU:   Understand these instructions.  Will watch your condition.  Will get help right away if you are not doing well or get worse. Document Released: 02/11/2005 Document Revised: 06/28/2013 Document Reviewed: 12/04/2012 [facility] Patient Information 2015 [place], [place]. This information is not intended to replace advice given to you by your health care provider. Make sure you discuss any questions you have with your health care provider.

## 2014-02-23 NOTE — Progress Notes (Signed)
   Subjective:    Patient ID: Brad Hayes, male    DOB: 09/19/1960, 53 y.o.   MRN: [id number]  Chief Complaint  Patient presents with  . Hypertension    188/150 was his BP on saturday when his brother checked, getting more headaches, x1 month    HPI:  Brad Hayes is a 53 y.o. male who presents today for an acute visit.  Hypertension was previously stable and maintained on amlodipine-olmesartan. Patient reports an increased blood pressure of 188/150 on Saturday. Indicates that he is also experiencing increased frequency of headaches. States he has not taken his blood pressure medication in about 3 months secondary to running out of the medication that was provided as samples. Headaches are described as generalized and sometimes pulsating. He has been treating them with Advil and BC powder which have provided adequate relief. His eye exam is up-to-date.  BP Readings from Last 3 Encounters:  02/23/14 152/100  03/03/13 120/88  09/28/12 133/82   No Known Allergies  No current outpatient prescriptions on file prior to visit.   No current facility-administered medications on file prior to visit.    Review of Systems  Respiratory: Negative for shortness of breath.   Cardiovascular: Negative for chest pain, palpitations and leg swelling.  Neurological: Positive for headaches.      Objective:    BP 152/100 mmHg  Pulse 68  Temp(Src) 98.1 F (36.7 C) (Oral)  Resp 18  Ht 6' (1.829 m)  Wt 228 lb 3.2 oz (103.511 kg)  BMI 30.94 kg/m2  SpO2 97% Nursing note and vital signs reviewed.  Physical Exam  Constitutional: He is oriented to person, place, and time. He appears well-developed and well-nourished. No distress.  Cardiovascular: Normal rate, regular rhythm, normal heart sounds and intact distal pulses.   Pulmonary/Chest: Effort normal and breath sounds normal.  Neurological: He is alert and oriented to person, place, and time.  Skin: Skin is warm and dry.  Psychiatric:  He has a normal mood and affect. His behavior is normal. Judgment and thought content normal.       Assessment & Plan:

## 2014-02-23 NOTE — Assessment & Plan Note (Signed)
Blood pressure has been elevated for the past 3 months secondary to not taking medication. Restart amlodipine-olmesartan at previous dose. Monitor blood pressure at home. Headaches are most likely related to his increased blood pressure. Follow up if symptoms worsen or fail to improve or she can emergency care if forced headache of life develops.

## 2014-02-23 NOTE — Progress Notes (Signed)
Pre visit review using our clinic review tool, if applicable. No additional management support is needed unless otherwise documented below in the visit note. 

## 2014-06-23 ENCOUNTER — Ambulatory Visit (INDEPENDENT_AMBULATORY_CARE_PROVIDER_SITE_OTHER): Payer: BC Managed Care – PPO | Admitting: Internal Medicine

## 2014-06-23 ENCOUNTER — Encounter: Payer: Self-pay | Admitting: Internal Medicine

## 2014-06-23 VITALS — BP 132/78 | HR 87 | Temp 98.2°F | Resp 18 | Ht 72.0 in | Wt 224.0 lb

## 2014-06-23 DIAGNOSIS — M545 Low back pain, unspecified: Secondary | ICD-10-CM

## 2014-06-23 DIAGNOSIS — J309 Allergic rhinitis, unspecified: Secondary | ICD-10-CM | POA: Diagnosis not present

## 2014-06-23 DIAGNOSIS — J029 Acute pharyngitis, unspecified: Secondary | ICD-10-CM | POA: Insufficient documentation

## 2014-06-23 MED ORDER — DICLOFENAC SODIUM 75 MG PO TBEC: 75.0000 mg | DELAYED_RELEASE_TABLET | Freq: Two times a day (BID) | ORAL | Status: DC | PRN

## 2014-06-23 MED ORDER — CYCLOBENZAPRINE HCL 5 MG PO TABS: 5.0000 mg | ORAL_TABLET | Freq: Three times a day (TID) | ORAL | Status: DC | PRN

## 2014-06-23 MED ORDER — CETIRIZINE HCL 10 MG PO TABS: 10.0000 mg | ORAL_TABLET | Freq: Every day | ORAL | Status: DC

## 2014-06-23 MED ORDER — AZITHROMYCIN 250 MG PO TABS: ORAL_TABLET | ORAL | Status: DC

## 2014-06-23 MED ORDER — TRIAMCINOLONE ACETONIDE 55 MCG/ACT NA AERO: 2.0000 | INHALATION_SPRAY | Freq: Every day | NASAL | Status: DC

## 2014-06-23 NOTE — Patient Instructions (Signed)
Please take all new medication as prescribed  Please continue all other medications as before, and refills have been done if requested.  Please have the pharmacy call with any other refills you may need.  Please keep your appointments with your specialists as you may have planned

## 2014-06-23 NOTE — Progress Notes (Signed)
   Subjective:    Patient ID: Brad Hayes, male    DOB: February 21, 1961, 54 y.o.   MRN: [id number]  HPI   Here with 2-3 days acute onset fever, severe ST, facial pain, pressure, headache, general weakness and malaise, but pt denies chest pain, wheezing, increased sob or doe, orthopnea, PND, increased LE swelling, palpitations, dizziness or syncope. Does have several wks ongoing nasal allergy symptoms with clearish congestion, itch and sneezing, without fever, pain, ST, cough, swelling or wheezing.  Pt continues to have recurring LBP without change in severity, bowel or bladder change, fever, wt loss,  worsening LE pain/numbness/weakness except for some radiation to both upper legs, and no gait change or falls. Past Medical History  Diagnosis Date  . Hypertension   . OSA (obstructive sleep apnea)   . Chronic insomnia    Past Surgical History  Procedure Laterality Date  . Dental extraction      reports that he has never smoked. He has never used smokeless tobacco. He reports that he drinks about 3.0 oz of alcohol per week. He reports that he does not use illicit drugs. family history includes Alcohol abuse in his father; Hypertension in his brother, mother, and sister; Sickle cell anemia in his mother. There is no history of Stroke, Kidney disease, Heart disease, Hyperlipidemia, Early death, Diabetes, COPD, or Cancer. No Known Allergies Current Outpatient Prescriptions on File Prior to Visit  Medication Sig Dispense Refill  . amLODipine-olmesartan (AZOR) 10-40 MG per tablet Take 1 tablet by mouth daily. 90 tablet 3  . tadalafil (CIALIS) 20 MG tablet Take 1 tablet (20 mg total) by mouth daily as needed. (Patient not taking: Reported on 06/23/2014) 10 tablet 11   No current facility-administered medications on file prior to visit.   Review of Systems  Constitutional: Negative for unusual diaphoresis or night sweats HENT: Negative for ringing in ear or discharge Eyes: Negative for double vision  or worsening visual disturbance.  Respiratory: Negative for choking and stridor.   Gastrointestinal: Negative for vomiting or other signifcant bowel change Genitourinary: Negative for hematuria or change in urine volume.  Musculoskeletal: Negative for other MSK pain or swelling Skin: Negative for color change and worsening wound.  Neurological: Negative for tremors and numbness other than noted  Psychiatric/Behavioral: Negative for decreased concentration or agitation other than above       Objective:   Physical Exam BP 132/78 mmHg  Pulse 87  Temp(Src) 98.2 F (36.8 C) (Oral)  Resp 18  Ht 6' (1.829 m)  Wt 224 lb (101.606 kg)  BMI 30.37 kg/m2  SpO2 95% VS noted, mild ill Constitutional: Pt appears in no significant distress HENT: Head: NCAT.  Right Ear: External ear normal.  Left Ear: External ear normal.  Bilat tm's with mild erythema.  Max sinus areas mild tender.  Pharynx with mild erythema, no exudate Eyes: . Pupils are equal, round, and reactive to light. Conjunctivae and EOM are normal Neck: Normal range of motion. Neck supple.  Cardiovascular: Normal rate and regular rhythm.   Pulmonary/Chest: Effort normal and breath sounds without rales or wheezing.  Spine: nontender Neurological: Pt is alert. Not confused , motor grossly intact Skin: Skin is warm. No rash, no LE edema Psychiatric: Pt behavior is normal. No agitation.     Assessment & Plan:

## 2014-06-25 NOTE — Assessment & Plan Note (Signed)
Mild to mod, for zyrtec, flonase asd,  to f/u any worsening symptoms or concerns

## 2014-06-25 NOTE — Assessment & Plan Note (Signed)
Mild to mod, for antibx course,  to f/u any worsening symptoms or concerns 

## 2014-06-25 NOTE — Assessment & Plan Note (Signed)
Chronic recurrent, no neuro changes, for nsaid prn, flexeril trial as well,  to f/u any worsening symptoms or concerns

## 2014-08-24 ENCOUNTER — Ambulatory Visit (INDEPENDENT_AMBULATORY_CARE_PROVIDER_SITE_OTHER): Payer: BC Managed Care – PPO | Admitting: Internal Medicine

## 2014-08-24 ENCOUNTER — Encounter: Payer: Self-pay | Admitting: Internal Medicine

## 2014-08-24 VITALS — BP 128/82 | HR 79 | Temp 98.4°F | Resp 16 | Ht 72.0 in | Wt 223.0 lb

## 2014-08-24 DIAGNOSIS — J209 Acute bronchitis, unspecified: Secondary | ICD-10-CM | POA: Diagnosis not present

## 2014-08-24 MED ORDER — AZITHROMYCIN 500 MG PO TABS: 500.0000 mg | ORAL_TABLET | Freq: Every day | ORAL | Status: DC

## 2014-08-24 MED ORDER — HYDROCOD POLST-CPM POLST ER 10-8 MG/5ML PO SUER: 5.0000 mL | Freq: Two times a day (BID) | ORAL | Status: DC | PRN

## 2014-08-24 NOTE — Patient Instructions (Signed)

## 2014-08-24 NOTE — Progress Notes (Signed)
Subjective:  Patient ID: Brad Hayes, male    DOB: Jun 07, 1960  Age: 54 y.o. MRN: [id number]  CC: Cough   HPI Gavin Miquan Tandon presents for cough productive of yellow phlegm for one week.  Outpatient Prescriptions Prior to Visit  Medication Sig Dispense Refill  . amLODipine-olmesartan (AZOR) 10-40 MG per tablet Take 1 tablet by mouth daily. 90 tablet 3  . cetirizine (ZYRTEC) 10 MG tablet Take 1 tablet (10 mg total) by mouth daily. 30 tablet 11  . cyclobenzaprine (FLEXERIL) 5 MG tablet Take 1 tablet (5 mg total) by mouth 3 (three) times daily as needed for muscle spasms. 40 tablet 1  . diclofenac (VOLTAREN) 75 MG EC tablet Take 1 tablet (75 mg total) by mouth 2 (two) times daily as needed. 60 tablet 1  . azithromycin (ZITHROMAX Z-PAK) 250 MG tablet Use as directed (Patient not taking: Reported on 08/24/2014) 6 tablet 1  . tadalafil (CIALIS) 20 MG tablet Take 1 tablet (20 mg total) by mouth daily as needed. (Patient not taking: Reported on 06/23/2014) 10 tablet 11  . triamcinolone (NASACORT AQ) 55 MCG/ACT AERO nasal inhaler Place 2 sprays into the nose daily. (Patient not taking: Reported on 08/24/2014) 1 Inhaler 12   No facility-administered medications prior to visit.    ROS Review of Systems  Constitutional: Negative.  Negative for fever, chills, diaphoresis, appetite change and fatigue.  HENT: Positive for sore throat. Negative for congestion, postnasal drip, sinus pressure, sneezing and trouble swallowing.   Eyes: Negative.   Respiratory: Positive for cough. Negative for apnea, choking, chest tightness, shortness of breath, wheezing and stridor.   Cardiovascular: Negative.  Negative for chest pain, palpitations and leg swelling.  Gastrointestinal: Negative.  Negative for abdominal pain, constipation and blood in stool.  Endocrine: Negative.   Genitourinary: Negative.   Musculoskeletal: Negative.   Skin: Negative.   Allergic/Immunologic: Negative.   Neurological: Negative.   Negative for dizziness, tremors, syncope, light-headedness, numbness and headaches.  Hematological: Negative.  Negative for adenopathy. Does not bruise/bleed easily.  Psychiatric/Behavioral: Negative.     Objective:  BP 128/82 mmHg  Pulse 79  Temp(Src) 98.4 F (36.9 C) (Oral)  Resp 16  Ht 6' (1.829 m)  Wt 223 lb (101.152 kg)  BMI 30.24 kg/m2  SpO2 97%  BP Readings from Last 3 Encounters:  08/24/14 128/82  06/23/14 132/78  02/23/14 152/100    Wt Readings from Last 3 Encounters:  08/24/14 223 lb (101.152 kg)  06/23/14 224 lb (101.606 kg)  02/23/14 228 lb 3.2 oz (103.511 kg)    Physical Exam  Constitutional: He is oriented to person, place, and time. He appears well-developed and well-nourished.  Non-toxic appearance. He does not have a sickly appearance. He does not appear ill. No distress.  HENT:  Head: Normocephalic and atraumatic.  Mouth/Throat: Oropharynx is clear and moist. No oropharyngeal exudate.  Eyes: Conjunctivae are normal. Right eye exhibits no discharge. Left eye exhibits no discharge. No scleral icterus.  Neck: Normal range of motion. Neck supple. No JVD present. No tracheal deviation present. No thyromegaly present.  Cardiovascular: Normal rate, regular rhythm, normal heart sounds and intact distal pulses.  Exam reveals no gallop and no friction rub.   No murmur heard. Pulmonary/Chest: Effort normal and breath sounds normal. No stridor. No respiratory distress. He has no wheezes. He has no rales. He exhibits no tenderness.  Abdominal: Soft. Bowel sounds are normal. He exhibits no distension and no mass. There is no tenderness. There is no rebound  and no guarding.  Musculoskeletal: Normal range of motion. He exhibits no edema or tenderness.  Lymphadenopathy:    He has no cervical adenopathy.  Neurological: He is oriented to person, place, and time.  Skin: Skin is warm and dry. No rash noted. He is not diaphoretic. No erythema. No pallor.  Psychiatric: He has a  normal mood and affect. His behavior is normal. Judgment and thought content normal.  Vitals reviewed.   Lab Results  Component Value Date   WBC 4.7 03/10/2012   HGB 13.1 03/10/2012   HCT 39.3 03/10/2012   PLT 227.0 03/10/2012   GLUCOSE 122* 03/10/2012   CHOL 104 03/10/2012   TRIG 153.0* 03/10/2012   HDL 33.20* 03/10/2012   LDLCALC 40 03/10/2012   ALT 37 03/10/2012   AST 27 03/10/2012   NA 138 03/10/2012   K 3.6 03/10/2012   CL 103 03/10/2012   CREATININE 1.3 03/10/2012   BUN 12 03/10/2012   CO2 27 03/10/2012   TSH 1.73 03/10/2012   PSA 0.69 03/10/2012   HGBA1C 5.0 03/10/2012    Dg Lumbar Spine Complete  04/28/2012   *RADIOLOGY REPORT*  Clinical Data: Chronic low back pain, bilateral leg pain for 3 days  LUMBAR SPINE - COMPLETE 4+ VIEW  Comparison: Lumbar spine films of 09/13/2011  Findings: The lumbar vertebrae are in normal alignment. Intervertebral disc spaces appear normal.  No compression deformity is seen.  No significant degenerative change is noted.  The SI joints appear normally corticated.  IMPRESSION: Normal alignment.  Normal intervertebral disc spaces.   Original Report Authenticated By: Ivar Drape, M.D.    Assessment & Plan:   Trust was seen today for cough.  Diagnoses and all orders for this visit:  Acute bronchitis, unspecified organism - will treat the infection with z-pak and will control the cough with tussionex susp Orders: -     azithromycin (ZITHROMAX) 500 MG tablet; Take 1 tablet (500 mg total) by mouth daily. -     chlorpheniramine-HYDROcodone (TUSSIONEX PENNKINETIC ER) 10-8 MG/5ML SUER; Take 5 mLs by mouth every 12 (twelve) hours as needed for cough.   I have discontinued Mr. Amon tadalafil, triamcinolone, and azithromycin. I am also having him start on azithromycin and chlorpheniramine-HYDROcodone. Additionally, I am having him maintain his amLODipine-olmesartan, cetirizine, diclofenac, and cyclobenzaprine.  Meds ordered this encounter    Medications  . azithromycin (ZITHROMAX) 500 MG tablet    Sig: Take 1 tablet (500 mg total) by mouth daily.    Dispense:  3 tablet    Refill:  0  . chlorpheniramine-HYDROcodone (TUSSIONEX PENNKINETIC ER) 10-8 MG/5ML SUER    Sig: Take 5 mLs by mouth every 12 (twelve) hours as needed for cough.    Dispense:  115 mL    Refill:  0     Follow-up: Return in about 3 weeks (around 09/14/2014).  Scarlette Calico, MD

## 2014-08-24 NOTE — Progress Notes (Signed)
Pre visit review using our clinic review tool, if applicable. No additional management support is needed unless otherwise documented below in the visit note. 

## 2014-08-30 ENCOUNTER — Ambulatory Visit (INDEPENDENT_AMBULATORY_CARE_PROVIDER_SITE_OTHER): Payer: BC Managed Care – PPO | Admitting: Internal Medicine

## 2014-08-30 ENCOUNTER — Telehealth: Payer: Self-pay | Admitting: Internal Medicine

## 2014-08-30 ENCOUNTER — Encounter: Payer: Self-pay | Admitting: Internal Medicine

## 2014-08-30 VITALS — BP 138/86 | HR 90 | Temp 98.3°F | Ht 72.0 in | Wt 231.0 lb

## 2014-08-30 DIAGNOSIS — J209 Acute bronchitis, unspecified: Secondary | ICD-10-CM

## 2014-08-30 DIAGNOSIS — R739 Hyperglycemia, unspecified: Secondary | ICD-10-CM | POA: Diagnosis not present

## 2014-08-30 DIAGNOSIS — R062 Wheezing: Secondary | ICD-10-CM | POA: Diagnosis not present

## 2014-08-30 DIAGNOSIS — I10 Essential (primary) hypertension: Secondary | ICD-10-CM

## 2014-08-30 MED ORDER — HYDROCOD POLST-CPM POLST ER 10-8 MG/5ML PO SUER: 5.0000 mL | Freq: Two times a day (BID) | ORAL | Status: DC | PRN

## 2014-08-30 MED ORDER — METHYLPREDNISOLONE ACETATE 80 MG/ML IJ SUSP
80.0000 mg | Freq: Once | INTRAMUSCULAR | Status: AC
Start: 2014-08-30 — End: 2014-08-30
  Administered 2014-08-30: 80 mg via INTRAMUSCULAR

## 2014-08-30 MED ORDER — PREDNISONE 10 MG PO TABS: ORAL_TABLET | ORAL | Status: DC

## 2014-08-30 MED ORDER — LEVOFLOXACIN 500 MG PO TABS: 500.0000 mg | ORAL_TABLET | Freq: Every day | ORAL | Status: DC

## 2014-08-30 NOTE — Telephone Encounter (Signed)
She needs to be seen for a recheck and a CXR if she has not improved

## 2014-08-30 NOTE — Patient Instructions (Signed)
You had the steroid shot today  Please take all new medication as prescribed - the antibiotic, and prednisone  Please continue all other medications as before, and refills have been done if requested - the cough medicine  Please have the pharmacy call with any other refills you may need.  Please keep your appointments with your specialists as you may have planned

## 2014-08-30 NOTE — Progress Notes (Signed)
Pre visit review using our clinic review tool, if applicable. No additional management support is needed unless otherwise documented below in the visit note. 

## 2014-08-30 NOTE — Telephone Encounter (Signed)
Patient was in on 6/29 for bronchitis and he is requesting refill for azithromycin (ZITHROMAX) 500 MG tablet  And chlorpheniramine-HYDROcodone [facility] ER) 10-8 MG/5ML Latanya Presser [[id number]. Pharmacy is [facility] on [address]

## 2014-08-30 NOTE — Telephone Encounter (Signed)
Pt informed/transferred to scheduler.

## 2014-08-30 NOTE — Progress Notes (Signed)
   Subjective:    Patient ID: Brad Hayes, male    DOB: 12-18-1960, 54 y.o.   MRN: [id number]  HPI  Here with acute onset mild to mod 2-3 days ST, HA, general weakness and malaise, with prod cough greenish sputum, but Pt denies chest pain, increased sob or doe, wheezing, orthopnea, PND, increased LE swelling, palpitations, dizziness or syncope, except for onset mild wheezing last PM. Pt denies new neurological symptoms such as new headache, or facial or extremity weakness or numbness   Pt denies polydipsia, polyuria, Past Medical History  Diagnosis Date  . Hypertension   . OSA (obstructive sleep apnea)   . Chronic insomnia    Past Surgical History  Procedure Laterality Date  . Dental extraction      reports that he has never smoked. He has never used smokeless tobacco. He reports that he drinks about 3.0 oz of alcohol per week. He reports that he does not use illicit drugs. family history includes Alcohol abuse in his father; Hypertension in his brother, mother, and sister; Sickle cell anemia in his mother. There is no history of Stroke, Kidney disease, Heart disease, Hyperlipidemia, Early death, Diabetes, COPD, or Cancer. No Known Allergies Current Outpatient Prescriptions on File Prior to Visit  Medication Sig Dispense Refill  . amLODipine-olmesartan (AZOR) 10-40 MG per tablet Take 1 tablet by mouth daily. 90 tablet 3  . cetirizine (ZYRTEC) 10 MG tablet Take 1 tablet (10 mg total) by mouth daily. 30 tablet 11  . cyclobenzaprine (FLEXERIL) 5 MG tablet Take 1 tablet (5 mg total) by mouth 3 (three) times daily as needed for muscle spasms. 40 tablet 1  . diclofenac (VOLTAREN) 75 MG EC tablet Take 1 tablet (75 mg total) by mouth 2 (two) times daily as needed. 60 tablet 1   No current facility-administered medications on file prior to visit.     Review of Systems  Constitutional: Negative for unusual diaphoresis or night sweats HENT: Negative for ringing in ear or discharge Eyes:  Negative for double vision or worsening visual disturbance.  Respiratory: Negative for choking and stridor.   Gastrointestinal: Negative for vomiting or other signifcant bowel change Genitourinary: Negative for hematuria or change in urine volume.  Musculoskeletal: Negative for other MSK pain or swelling Skin: Negative for color change and worsening wound.  Neurological: Negative for tremors and numbness other than noted  Psychiatric/Behavioral: Negative for decreased concentration or agitation other than above       Objective:   Physical Exam BP 138/86 mmHg  Pulse 90  Temp(Src) 98.3 F (36.8 C) (Oral)  Ht 6' (1.829 m)  Wt 231 lb (104.781 kg)  BMI 31.32 kg/m2  SpO2 96% VS noted,  Constitutional: Pt appears in no significant distress HENT: Head: NCAT.  Right Ear: External ear normal.  Left Ear: External ear normal.  Eyes: . Pupils are equal, round, and reactive to light. Conjunctivae and EOM are normal Neck: Normal range of motion. Neck supple.  Cardiovascular: Normal rate and regular rhythm.   Pulmonary/Chest: Effort normal and breath sounds decreased without rales but + mild bilat wheezing.  Neurological: Pt is alert. Not confused , motor grossly intact Skin: Skin is warm. No rash, no LE edema Psychiatric: Pt behavior is normal. No agitation.        Assessment & Plan:

## 2014-09-01 NOTE — Assessment & Plan Note (Signed)
Mild to mod, for antibx course,  to f/u any worsening symptoms or concerns, declines cxr

## 2014-09-01 NOTE — Assessment & Plan Note (Signed)
/  Mild to mod, for predpac asd,  to f/u any worsening symptoms or concerns 

## 2014-09-01 NOTE — Assessment & Plan Note (Signed)
stable overall by history and exam, recent data reviewed with pt, and pt to continue medical treatment as before,  to f/u any worsening symptoms or concerns BP Readings from Last 3 Encounters:  08/30/14 138/86  08/24/14 128/82  06/23/14 132/78

## 2014-09-01 NOTE — Assessment & Plan Note (Signed)
stable overall by history and exam, recent data reviewed with pt, and pt to continue medical treatment as before,  to f/u any worsening symptoms or concerns Lab Results  Component Value Date   HGBA1C 5.0 03/10/2012   Pt to call for onset polys or cbg > 200 on prednisone

## 2014-09-13 ENCOUNTER — Telehealth: Payer: Self-pay | Admitting: Internal Medicine

## 2014-09-13 DIAGNOSIS — J209 Acute bronchitis, unspecified: Secondary | ICD-10-CM

## 2014-09-13 NOTE — Telephone Encounter (Signed)
Patient is requesting script for cough medicine to be sent to [facility] on [address]

## 2014-09-13 NOTE — Telephone Encounter (Signed)
Pt seen by Dr Jenny Reichmann 08/30/2014, please advise

## 2014-09-15 MED ORDER — HYDROCOD POLST-CPM POLST ER 10-8 MG/5ML PO SUER: 5.0000 mL | Freq: Two times a day (BID) | ORAL | Status: DC | PRN

## 2014-09-15 NOTE — Telephone Encounter (Signed)
Done hardcopy to Dahlia  

## 2014-09-16 NOTE — Telephone Encounter (Signed)
Update: Pt informed, Rx in cabinet for pt pick up

## 2014-09-16 NOTE — Telephone Encounter (Signed)
Rx faxed to pharmacy  

## 2014-10-04 ENCOUNTER — Encounter: Payer: Self-pay | Admitting: Family

## 2014-10-04 ENCOUNTER — Ambulatory Visit (INDEPENDENT_AMBULATORY_CARE_PROVIDER_SITE_OTHER): Payer: BC Managed Care – PPO | Admitting: Family

## 2014-10-04 ENCOUNTER — Telehealth: Payer: Self-pay | Admitting: Family

## 2014-10-04 ENCOUNTER — Ambulatory Visit (INDEPENDENT_AMBULATORY_CARE_PROVIDER_SITE_OTHER)
Admission: RE | Admit: 2014-10-04 | Discharge: 2014-10-04 | Disposition: A | Discharge status: Home / Self Care | Payer: BC Managed Care – PPO | Source: Ambulatory Visit | Attending: Family | Admitting: Family

## 2014-10-04 VITALS — BP 138/88 | HR 77 | Temp 98.0°F | Resp 18 | Ht 72.0 in | Wt 227.0 lb

## 2014-10-04 DIAGNOSIS — J209 Acute bronchitis, unspecified: Secondary | ICD-10-CM | POA: Diagnosis not present

## 2014-10-04 DIAGNOSIS — R05 Cough: Secondary | ICD-10-CM | POA: Insufficient documentation

## 2014-10-04 DIAGNOSIS — R059 Cough, unspecified: Secondary | ICD-10-CM

## 2014-10-04 MED ORDER — PREDNISONE 20 MG PO TABS: 20.0000 mg | ORAL_TABLET | Freq: Two times a day (BID) | ORAL | Status: DC

## 2014-10-04 MED ORDER — HYDROCOD POLST-CPM POLST ER 10-8 MG/5ML PO SUER: 5.0000 mL | Freq: Two times a day (BID) | ORAL | Status: DC | PRN

## 2014-10-04 NOTE — Patient Instructions (Signed)
Thank you for choosing [facility].  Summary/Instructions:  Your prescription(s) have been submitted to your pharmacy or been printed and provided for you. Please take as directed and contact our office if you believe you are having problem(s) with the medication(s) or have any questions.  Please stop by radiology on the basement level of the building for your x-rays. Your results will be released to [place] (or called to you) after review, usually within 72 hours after test completion. If any treatments or changes are necessary, you will be notified at that same time.  If your symptoms worsen or fail to improve, please contact our office for further instruction, or in case of emergency go directly to the emergency room at the closest medical facility.   Cough, Adult  A cough is a reflex that helps clear your throat and airways. It can help heal the body or may be a reaction to an irritated airway. A cough may only last 2 or 3 weeks (acute) or may last more than 8 weeks (chronic).  CAUSES Acute cough:  Viral or bacterial infections. Chronic cough:  Infections.  Allergies.  Asthma.  Post-nasal drip.  Smoking.  Heartburn or acid reflux.  Some medicines.  Chronic lung problems (COPD).  Cancer. SYMPTOMS   Cough.  Fever.  Chest pain.  Increased breathing rate.  High-pitched whistling sound when breathing (wheezing).  Colored mucus that you cough up (sputum). TREATMENT   A bacterial cough may be treated with antibiotic medicine.  A viral cough must run its course and will not respond to antibiotics.  Your caregiver may recommend other treatments if you have a chronic cough. HOME CARE INSTRUCTIONS   Only take over-the-counter or prescription medicines for pain, discomfort, or fever as directed by your caregiver. Use cough suppressants only as directed by your caregiver.  Use a cold steam vaporizer or humidifier in your bedroom or home to help loosen  secretions.  Sleep in a semi-upright position if your cough is worse at night.  Rest as needed.  Stop smoking if you smoke. SEEK IMMEDIATE MEDICAL CARE IF:   You have pus in your sputum.  Your cough starts to worsen.  You cannot control your cough with suppressants and are losing sleep.  You begin coughing up blood.  You have difficulty breathing.  You develop pain which is getting worse or is uncontrolled with medicine.  You have a fever. MAKE SURE YOU:   Understand these instructions.  Will watch your condition.  Will get help right away if you are not doing well or get worse. Document Released: 08/10/2010 Document Revised: 05/06/2011 Document Reviewed: 08/10/2010 [facility] Patient Information 2015 [place], [place]. This information is not intended to replace advice given to you by your health care provider. Make sure you discuss any questions you have with your health care provider.

## 2014-10-04 NOTE — Assessment & Plan Note (Signed)
Cough is slowly improving with time. Completed courses of Azithromycin and levofloxacin decrease likelihood of infection. Most likely reactive airway cough or resolving bronchitis. Start prednisone. Continue current dosage of Tussinex as needed for cough. Sample of Anoro provided. Obtain chest x-ray to rule out any underlying pathology. Follow up if symptoms worsen or do not improve with treatment or time.

## 2014-10-04 NOTE — Progress Notes (Signed)
Pre visit review using our clinic review tool, if applicable. No additional management support is needed unless otherwise documented below in the visit note. 

## 2014-10-04 NOTE — Progress Notes (Signed)
Subjective:    Patient ID: Brad Hayes, male    DOB: Jan 15, 1961, 54 y.o.   MRN: [id number]  Chief Complaint  Patient presents with  . Cough    x2 months, has been having a cough it only goes away on the tussionex but as soon as he is out of it he goes right back to coughing again    HPI:  Brad Hayes is a 54 y.o. male with a PMH of sleep apnea, low back pain, hypertension, and erectile dysfunction who presents today for an acute office visit.    Associated symptom of a cough has been going on for about 2 months. He was treated with azithromycin and Tussionex which did help with his symptoms initially. He was then seen in the office about a week later and was prescribed levaquin and prednisone which did help as well. Notes that when he runs out of the medication he begins to cough again.   No Known Allergies   Current Outpatient Prescriptions on File Prior to Visit  Medication Sig Dispense Refill  . amLODipine-olmesartan (AZOR) 10-40 MG per tablet Take 1 tablet by mouth daily. 90 tablet 3  . cetirizine (ZYRTEC) 10 MG tablet Take 1 tablet (10 mg total) by mouth daily. 30 tablet 11  . cyclobenzaprine (FLEXERIL) 5 MG tablet Take 1 tablet (5 mg total) by mouth 3 (three) times daily as needed for muscle spasms. 40 tablet 1  . diclofenac (VOLTAREN) 75 MG EC tablet Take 1 tablet (75 mg total) by mouth 2 (two) times daily as needed. 60 tablet 1   No current facility-administered medications on file prior to visit.    Review of Systems  Constitutional: Negative for fever and chills.  HENT: Negative for congestion, sinus pressure and sore throat.   Respiratory: Positive for cough, chest tightness, shortness of breath and wheezing.   Cardiovascular: Negative for chest pain and palpitations.  Neurological: Negative for headaches.      Objective:    BP 138/88 mmHg  Pulse 77  Temp(Src) 98 F (36.7 C) (Oral)  Resp 18  Ht 6' (1.829 m)  Wt 227 lb (102.967 kg)  BMI 30.78 kg/m2   SpO2 94% Nursing note and vital signs reviewed.  Physical Exam  Constitutional: He is oriented to person, place, and time. He appears well-developed and well-nourished. No distress.  Cardiovascular: Normal rate, regular rhythm, normal heart sounds and intact distal pulses.   Pulmonary/Chest: Effort normal and breath sounds normal.  Neurological: He is alert and oriented to person, place, and time.  Skin: Skin is warm and dry.  Psychiatric: He has a normal mood and affect. His behavior is normal. Judgment and thought content normal.       Assessment & Plan:   Problem List Items Addressed This Visit      Respiratory   Acute bronchitis - Primary     Other   Cough    Cough is slowly improving with time. Completed courses of Azithromycin and levofloxacin decrease likelihood of infection. Most likely reactive airway cough or resolving bronchitis. Start prednisone. Continue current dosage of Tussinex as needed for cough. Sample of Anoro provided. Obtain chest x-ray to rule out any underlying pathology. Follow up if symptoms worsen or do not improve with treatment or time.       Relevant Medications   predniSONE (DELTASONE) 20 MG tablet   chlorpheniramine-HYDROcodone (TUSSIONEX PENNKINETIC ER) 10-8 MG/5ML SUER   Other Relevant Orders   DG Chest 2 View (Completed)

## 2014-10-04 NOTE — Telephone Encounter (Signed)
Please inform patient that his chest x-ray is normal with no evidence of infection.

## 2014-10-07 NOTE — Telephone Encounter (Signed)
LVM for pt to call back.

## 2014-10-12 NOTE — Telephone Encounter (Signed)
Left wife results. Will tell pt when he gets off work.

## 2015-02-01 ENCOUNTER — Encounter: Payer: Self-pay | Admitting: Internal Medicine

## 2015-02-01 ENCOUNTER — Ambulatory Visit (INDEPENDENT_AMBULATORY_CARE_PROVIDER_SITE_OTHER)
Admission: RE | Admit: 2015-02-01 | Discharge: 2015-02-01 | Disposition: A | Discharge status: Home / Self Care | Payer: BC Managed Care – PPO | Source: Ambulatory Visit | Attending: Internal Medicine | Admitting: Internal Medicine

## 2015-02-01 ENCOUNTER — Ambulatory Visit (INDEPENDENT_AMBULATORY_CARE_PROVIDER_SITE_OTHER): Payer: BC Managed Care – PPO | Admitting: Internal Medicine

## 2015-02-01 VITALS — BP 150/98 | HR 90 | Temp 98.1°F | Resp 16 | Ht 73.0 in | Wt 228.0 lb

## 2015-02-01 DIAGNOSIS — J4521 Mild intermittent asthma with (acute) exacerbation: Secondary | ICD-10-CM

## 2015-02-01 DIAGNOSIS — R059 Cough, unspecified: Secondary | ICD-10-CM

## 2015-02-01 DIAGNOSIS — I10 Essential (primary) hypertension: Secondary | ICD-10-CM

## 2015-02-01 DIAGNOSIS — R05 Cough: Secondary | ICD-10-CM | POA: Diagnosis not present

## 2015-02-01 DIAGNOSIS — J45901 Unspecified asthma with (acute) exacerbation: Secondary | ICD-10-CM | POA: Insufficient documentation

## 2015-02-01 DIAGNOSIS — G4730 Sleep apnea, unspecified: Secondary | ICD-10-CM

## 2015-02-01 DIAGNOSIS — J189 Pneumonia, unspecified organism: Secondary | ICD-10-CM

## 2015-02-01 DIAGNOSIS — R739 Hyperglycemia, unspecified: Secondary | ICD-10-CM | POA: Diagnosis not present

## 2015-02-01 DIAGNOSIS — J181 Lobar pneumonia, unspecified organism: Secondary | ICD-10-CM

## 2015-02-01 DIAGNOSIS — J45909 Unspecified asthma, uncomplicated: Secondary | ICD-10-CM | POA: Insufficient documentation

## 2015-02-01 MED ORDER — HYDROCODONE-HOMATROPINE 5-1.5 MG/5ML PO SYRP: 5.0000 mL | ORAL_SOLUTION | Freq: Three times a day (TID) | ORAL | Status: DC | PRN

## 2015-02-01 MED ORDER — MOXIFLOXACIN HCL 400 MG PO TABS: 400.0000 mg | ORAL_TABLET | Freq: Every day | ORAL | Status: AC

## 2015-02-01 MED ORDER — MOMETASONE FURO-FORMOTEROL FUM 200-5 MCG/ACT IN AERO: 2.0000 | INHALATION_SPRAY | Freq: Two times a day (BID) | RESPIRATORY_TRACT | Status: DC

## 2015-02-01 MED ORDER — AMLODIPINE-OLMESARTAN 10-40 MG PO TABS: 1.0000 | ORAL_TABLET | Freq: Every day | ORAL | Status: DC

## 2015-02-01 MED ORDER — IPRATROPIUM-ALBUTEROL 0.5-2.5 (3) MG/3ML IN SOLN
3.0000 mL | Freq: Once | RESPIRATORY_TRACT | Status: AC
  Administered 2015-02-01: 3 mL via RESPIRATORY_TRACT

## 2015-02-01 MED ORDER — HYDROCHLOROTHIAZIDE 12.5 MG PO CAPS: 12.5000 mg | ORAL_CAPSULE | Freq: Every day | ORAL | Status: DC

## 2015-02-01 NOTE — Progress Notes (Signed)
Subjective:  Patient ID: Brad Hayes, male    DOB: Aug 02, 1960  Age: 54 y.o. MRN: [id number]  CC: Hypertension and Cough   HPI Firas Guardado presents for a 2 week hx of cough productive of thick/yellow phlegm with fever and chills. He was seen earlier this year for an episode of wheezing.  Outpatient Prescriptions Prior to Visit  Medication Sig Dispense Refill  . amLODipine-olmesartan (AZOR) 10-40 MG per tablet Take 1 tablet by mouth daily. 90 tablet 3  . cetirizine (ZYRTEC) 10 MG tablet Take 1 tablet (10 mg total) by mouth daily. (Patient not taking: Reported on 02/01/2015) 30 tablet 11  . chlorpheniramine-HYDROcodone (TUSSIONEX PENNKINETIC ER) 10-8 MG/5ML SUER Take 5 mLs by mouth every 12 (twelve) hours as needed for cough. 180 mL 0  . cyclobenzaprine (FLEXERIL) 5 MG tablet Take 1 tablet (5 mg total) by mouth 3 (three) times daily as needed for muscle spasms. (Patient not taking: Reported on 02/01/2015) 40 tablet 1  . diclofenac (VOLTAREN) 75 MG EC tablet Take 1 tablet (75 mg total) by mouth 2 (two) times daily as needed. (Patient not taking: Reported on 02/01/2015) 60 tablet 1  . predniSONE (DELTASONE) 20 MG tablet Take 1 tablet (20 mg total) by mouth 2 (two) times daily with a meal. 10 tablet 0   No facility-administered medications prior to visit.    ROS Review of Systems  Constitutional: Positive for fever, chills and unexpected weight change. Negative for diaphoresis, activity change, appetite change and fatigue.  HENT: Positive for sore throat. Negative for congestion, facial swelling, trouble swallowing and voice change.   Eyes: Negative.   Respiratory: Positive for apnea, cough, shortness of breath and wheezing. Negative for choking, chest tightness and stridor.   Cardiovascular: Negative.  Negative for chest pain, palpitations and leg swelling.  Gastrointestinal: Negative.  Negative for nausea, vomiting, abdominal pain, diarrhea and constipation.  Endocrine: Negative.     Genitourinary: Negative.  Negative for difficulty urinating.  Musculoskeletal: Negative.   Skin: Negative.   Allergic/Immunologic: Negative.   Neurological: Positive for headaches. Negative for dizziness, seizures, syncope, facial asymmetry, speech difficulty, weakness, light-headedness and numbness.  Hematological: Negative.  Negative for adenopathy. Does not bruise/bleed easily.  Psychiatric/Behavioral: Negative.     Objective:  BP 150/98 mmHg  Pulse 90  Temp(Src) 98.1 F (36.7 C) (Oral)  Resp 16  Ht 6' 1"  (1.854 m)  Wt 228 lb (103.42 kg)  BMI 30.09 kg/m2  SpO2 95%  BP Readings from Last 3 Encounters:  02/01/15 150/98  10/04/14 138/88  08/30/14 138/86    Wt Readings from Last 3 Encounters:  02/01/15 228 lb (103.42 kg)  10/04/14 227 lb (102.967 kg)  08/30/14 231 lb (104.781 kg)    Physical Exam  Constitutional: He is oriented to person, place, and time. He appears well-developed and well-nourished.  Non-toxic appearance. He does not have a sickly appearance. He does not appear ill. No distress.  HENT:  Head: Normocephalic and atraumatic.  Mouth/Throat: Oropharynx is clear and moist. No oropharyngeal exudate.  Eyes: Conjunctivae are normal. Right eye exhibits no discharge. Left eye exhibits no discharge. No scleral icterus.  Neck: Normal range of motion. Neck supple. No JVD present. No tracheal deviation present. No thyromegaly present.  Cardiovascular: Normal rate, regular rhythm, normal heart sounds and intact distal pulses.  Exam reveals no gallop and no friction rub.   No murmur heard. Pulmonary/Chest: Effort normal. No accessory muscle usage or stridor. No respiratory distress. He has no decreased breath  sounds. He has no wheezes. He has no rhonchi. He has rales in the right middle field. He exhibits no tenderness.  Abdominal: Soft. Bowel sounds are normal. He exhibits no distension and no mass. There is no tenderness. There is no rebound and no guarding.   Musculoskeletal: Normal range of motion. He exhibits no edema or tenderness.  Lymphadenopathy:    He has no cervical adenopathy.  Neurological: He is oriented to person, place, and time.  Skin: Skin is warm and dry. No rash noted. He is not diaphoretic. No erythema. No pallor.    Lab Results  Component Value Date   WBC 4.7 03/10/2012   HGB 13.1 03/10/2012   HCT 39.3 03/10/2012   PLT 227.0 03/10/2012   GLUCOSE 122* 03/10/2012   CHOL 104 03/10/2012   TRIG 153.0* 03/10/2012   HDL 33.20* 03/10/2012   LDLCALC 40 03/10/2012   ALT 37 03/10/2012   AST 27 03/10/2012   NA 138 03/10/2012   K 3.6 03/10/2012   CL 103 03/10/2012   CREATININE 1.3 03/10/2012   BUN 12 03/10/2012   CO2 27 03/10/2012   TSH 1.73 03/10/2012   PSA 0.69 03/10/2012   HGBA1C 5.0 03/10/2012    Dg Chest 2 View  10/04/2014  CLINICAL DATA:  Two months of cough, history of episodes of bronchitis with wheezing EXAM: CHEST  2 VIEW COMPARISON:  PA and lateral chest of September 13, 2011 FINDINGS: The lungs are well-expanded. There is no focal infiltrate. There is no pleural effusion. There are chronically increased lung markings overlying the cardiac apex. The heart and pulmonary vascularity are normal. The mediastinum is normal in width. The bony thorax is unremarkable. IMPRESSION: There is no active cardiopulmonary disease. Electronically Signed   By: David  Martinique M.D.   On: 10/04/2014 16:29    Assessment & Plan:   Javarius was seen today for hypertension and cough.  Diagnoses and all orders for this visit:  Essential hypertension, benign- his blood pressure is not well controlled, will continue Azor, will add hydrochlorothiazide to that for better blood pressure control. I've asked him to have his sleep apnea evaluated and treated as well, today I will check his labs to check for complications of hypertension and to screen for secondary causes of hypertension. -     Basic metabolic panel; Future -     TSH; Future -      Urinalysis, Routine w reflex microscopic (not at [facility]); Future -     CBC with Differential/Platelet; Future -     amLODipine-olmesartan (AZOR) 10-40 MG tablet; Take 1 tablet by mouth daily. -     hydrochlorothiazide (MICROZIDE) 12.5 MG capsule; Take 1 capsule (12.5 mg total) by mouth daily.  Cough- his chest x-ray is positive for right middle lobe infiltrate. -     DG Chest 2 View; Future -     HYDROcodone-homatropine (HYCODAN) 5-1.5 MG/5ML syrup; Take 5 mLs by mouth every 8 (eight) hours as needed for cough.  Sleep apnea -     Ambulatory referral to Pulmonology  Hyperglycemia- I will recheck his A1c to see if he has developed diabetes. -     Basic metabolic panel; Future -     Hemoglobin A1c; Future  Bilateral pneumonia- will treat the infection with Avelox and will use Hycodan to control the cough. -     moxifloxacin (AVELOX) 400 MG tablet; Take 1 tablet (400 mg total) by mouth daily. -     HYDROcodone-homatropine (HYCODAN) 5-1.5 MG/5ML syrup; Take  5 mLs by mouth every 8 (eight) hours as needed for cough.  Asthma with exacerbation, mild intermittent- will start Dulera -     mometasone-formoterol (DULERA) 200-5 MCG/ACT AERO; Inhale 2 puffs into the lungs 2 (two) times daily. -     ipratropium-albuterol (DUONEB) 0.5-2.5 (3) MG/3ML nebulizer solution 3 mL; Take 3 mLs by nebulization once.   I have discontinued Mr. Skog cetirizine, diclofenac, cyclobenzaprine, predniSONE, and chlorpheniramine-HYDROcodone. I have also changed his amLODipine-olmesartan. Additionally, I am having him start on hydrochlorothiazide, moxifloxacin, HYDROcodone-homatropine, and mometasone-formoterol. We administered ipratropium-albuterol.  Meds ordered this encounter  Medications  . amLODipine-olmesartan (AZOR) 10-40 MG tablet    Sig: Take 1 tablet by mouth daily.    Dispense:  90 tablet    Refill:  1  . hydrochlorothiazide (MICROZIDE) 12.5 MG capsule    Sig: Take 1 capsule (12.5 mg total) by mouth daily.      Dispense:  90 capsule    Refill:  1  . moxifloxacin (AVELOX) 400 MG tablet    Sig: Take 1 tablet (400 mg total) by mouth daily.    Dispense:  7 tablet    Refill:  0  . HYDROcodone-homatropine (HYCODAN) 5-1.5 MG/5ML syrup    Sig: Take 5 mLs by mouth every 8 (eight) hours as needed for cough.    Dispense:  120 mL    Refill:  0  . mometasone-formoterol (DULERA) 200-5 MCG/ACT AERO    Sig: Inhale 2 puffs into the lungs 2 (two) times daily.    Dispense:  13 g    Refill:  11  . ipratropium-albuterol (DUONEB) 0.5-2.5 (3) MG/3ML nebulizer solution 3 mL    Sig:      Follow-up: Return in about 3 weeks (around 02/22/2015).  Scarlette Calico, MD

## 2015-02-01 NOTE — Progress Notes (Signed)
Pre visit review using our clinic review tool, if applicable. No additional management support is needed unless otherwise documented below in the visit note. 

## 2015-02-01 NOTE — Patient Instructions (Signed)

## 2015-02-02 ENCOUNTER — Telehealth: Payer: Self-pay | Admitting: Internal Medicine

## 2015-02-02 NOTE — Telephone Encounter (Signed)
Pt called back and I informed him of Dr. Ronnald Ramp notes on his CXR and I have him scheduled in 3 wks to see Dr. Ronnald Ramp

## 2015-02-10 ENCOUNTER — Emergency Department (HOSPITAL_COMMUNITY)
Admission: EM | Admit: 2015-02-10 | Discharge: 2015-02-10 | Disposition: A | Discharge status: Home / Self Care | Payer: BC Managed Care – PPO | Source: Home / Self Care

## 2015-02-13 ENCOUNTER — Encounter: Payer: Self-pay | Admitting: Internal Medicine

## 2015-02-13 ENCOUNTER — Ambulatory Visit (INDEPENDENT_AMBULATORY_CARE_PROVIDER_SITE_OTHER)
Admission: RE | Admit: 2015-02-13 | Discharge: 2015-02-13 | Disposition: A | Discharge status: Home / Self Care | Payer: BC Managed Care – PPO | Source: Ambulatory Visit | Attending: Internal Medicine | Admitting: Internal Medicine

## 2015-02-13 ENCOUNTER — Ambulatory Visit (INDEPENDENT_AMBULATORY_CARE_PROVIDER_SITE_OTHER): Payer: BC Managed Care – PPO | Admitting: Internal Medicine

## 2015-02-13 VITALS — BP 118/82 | HR 86 | Temp 98.2°F | Resp 16 | Ht 73.0 in | Wt 231.0 lb

## 2015-02-13 DIAGNOSIS — J189 Pneumonia, unspecified organism: Secondary | ICD-10-CM

## 2015-02-13 DIAGNOSIS — J181 Lobar pneumonia, unspecified organism: Principal | ICD-10-CM

## 2015-02-13 MED ORDER — HYDROCOD POLST-CPM POLST ER 10-8 MG/5ML PO SUER: 5.0000 mL | Freq: Two times a day (BID) | ORAL | Status: DC | PRN

## 2015-02-13 NOTE — Progress Notes (Signed)
Pre visit review using our clinic review tool, if applicable. No additional management support is needed unless otherwise documented below in the visit note. 

## 2015-02-13 NOTE — Patient Instructions (Signed)

## 2015-02-13 NOTE — Progress Notes (Signed)
Subjective:  Patient ID: Brad Hayes, male    DOB: 1960/12/16  Age: 54 y.o. MRN: [id number]  CC: Cough   HPI Muhamed Luecke presents for f/up on PNA, he still has a mild NP cough but overall feels better.  Outpatient Prescriptions Prior to Visit  Medication Sig Dispense Refill  . amLODipine-olmesartan (AZOR) 10-40 MG tablet Take 1 tablet by mouth daily. 90 tablet 1  . hydrochlorothiazide (MICROZIDE) 12.5 MG capsule Take 1 capsule (12.5 mg total) by mouth daily. 90 capsule 1  . mometasone-formoterol (DULERA) 200-5 MCG/ACT AERO Inhale 2 puffs into the lungs 2 (two) times daily. 13 g 11  . HYDROcodone-homatropine (HYCODAN) 5-1.5 MG/5ML syrup Take 5 mLs by mouth every 8 (eight) hours as needed for cough. 120 mL 0   No facility-administered medications prior to visit.    ROS Review of Systems  Constitutional: Negative.  Negative for fever, chills, diaphoresis, appetite change and fatigue.  HENT: Negative.  Negative for facial swelling, sinus pressure and trouble swallowing.   Eyes: Negative.   Respiratory: Positive for cough. Negative for apnea, choking, chest tightness, shortness of breath, wheezing and stridor.   Cardiovascular: Negative.  Negative for chest pain, palpitations and leg swelling.  Gastrointestinal: Negative.  Negative for nausea, vomiting, abdominal pain, diarrhea, constipation and blood in stool.  Endocrine: Negative.   Genitourinary: Negative.   Musculoskeletal: Negative.  Negative for myalgias, back pain, joint swelling and arthralgias.  Skin: Negative.  Negative for color change and rash.  Allergic/Immunologic: Negative.   Neurological: Negative.  Negative for dizziness, weakness and light-headedness.  Hematological: Negative.  Negative for adenopathy. Does not bruise/bleed easily.  Psychiatric/Behavioral: Negative.     Objective:  BP 118/82 mmHg  Pulse 86  Temp(Src) 98.2 F (36.8 C) (Oral)  Resp 16  Ht 6' 1"  (1.854 m)  Wt 231 lb (104.781 kg)   BMI 30.48 kg/m2  SpO2 96%  BP Readings from Last 3 Encounters:  02/13/15 118/82  02/01/15 150/98  10/04/14 138/88    Wt Readings from Last 3 Encounters:  02/13/15 231 lb (104.781 kg)  02/01/15 228 lb (103.42 kg)  10/04/14 227 lb (102.967 kg)    Physical Exam  Constitutional: He is oriented to person, place, and time. No distress.  HENT:  Head: Normocephalic and atraumatic.  Mouth/Throat: Oropharynx is clear and moist. No oropharyngeal exudate.  Eyes: Conjunctivae are normal. Right eye exhibits no discharge. Left eye exhibits no discharge. No scleral icterus.  Neck: Normal range of motion. Neck supple. No JVD present. No tracheal deviation present. No thyromegaly present.  Cardiovascular: Normal rate, regular rhythm, normal heart sounds and intact distal pulses.  Exam reveals no gallop and no friction rub.   No murmur heard. Pulmonary/Chest: Effort normal and breath sounds normal. No stridor. No respiratory distress. He has no wheezes. He has no rales. He exhibits no tenderness.  Abdominal: Soft. Bowel sounds are normal. He exhibits no distension and no mass. There is no tenderness. There is no rebound and no guarding.  Musculoskeletal: Normal range of motion. He exhibits no edema or tenderness.  Lymphadenopathy:    He has no cervical adenopathy.  Neurological: He is oriented to person, place, and time.  Skin: Skin is warm and dry. No rash noted. He is not diaphoretic. No erythema. No pallor.  Vitals reviewed.   Lab Results  Component Value Date   WBC 4.7 03/10/2012   HGB 13.1 03/10/2012   HCT 39.3 03/10/2012   PLT 227.0 03/10/2012   GLUCOSE  122* 03/10/2012   CHOL 104 03/10/2012   TRIG 153.0* 03/10/2012   HDL 33.20* 03/10/2012   LDLCALC 40 03/10/2012   ALT 37 03/10/2012   AST 27 03/10/2012   NA 138 03/10/2012   K 3.6 03/10/2012   CL 103 03/10/2012   CREATININE 1.3 03/10/2012   BUN 12 03/10/2012   CO2 27 03/10/2012   TSH 1.73 03/10/2012   PSA 0.69 03/10/2012    HGBA1C 5.0 03/10/2012    Dg Chest 2 View  02/13/2015  CLINICAL DATA:  Pneumonia.  Cough. EXAM: CHEST  2 VIEW COMPARISON:  02/01/2015 .  10/04/2014 . FINDINGS: Mediastinum and hilar structures normal. Heart size normal. Mild bibasilar subsegmental atelectasis. No pleural effusion or pneumothorax. No acute bony abnormality . IMPRESSION: Mild bibasilar subsegmental atelectasis and/or scarring. Electronically Signed   By: Marcello Moores  Register   On: 02/13/2015 10:28    Assessment & Plan:   Kaushik was seen today for cough.  Diagnoses and all orders for this visit:  RML pneumonia- overall improvement noted, he has a persistent cough and complains that Hycodan is not controlling the cough, will offer him a prescription for Tussionex for better symptom relief. He will let me know if he develops any new symptoms. -     DG Chest 2 View; Future -     chlorpheniramine-HYDROcodone (TUSSIONEX PENNKINETIC ER) 10-8 MG/5ML SUER; Take 5 mLs by mouth every 12 (twelve) hours as needed for cough.   I have discontinued Mr. Grammatico HYDROcodone-homatropine. I am also having him start on chlorpheniramine-HYDROcodone. Additionally, I am having him maintain his amLODipine-olmesartan, hydrochlorothiazide, and mometasone-formoterol.  Meds ordered this encounter  Medications  . chlorpheniramine-HYDROcodone (TUSSIONEX PENNKINETIC ER) 10-8 MG/5ML SUER    Sig: Take 5 mLs by mouth every 12 (twelve) hours as needed for cough.    Dispense:  140 mL    Refill:  0     Follow-up: Return in about 2 weeks (around 02/27/2015).  Scarlette Calico, MD

## 2015-02-22 ENCOUNTER — Ambulatory Visit: Payer: BC Managed Care – PPO | Admitting: Internal Medicine

## 2015-03-15 ENCOUNTER — Telehealth: Payer: Self-pay | Admitting: Internal Medicine

## 2015-03-15 DIAGNOSIS — J189 Pneumonia, unspecified organism: Secondary | ICD-10-CM

## 2015-03-15 DIAGNOSIS — J181 Lobar pneumonia, unspecified organism: Principal | ICD-10-CM

## 2015-03-15 NOTE — Telephone Encounter (Signed)
Pt was in last month with pneumonia. His cough has come back and he has run out of chlorpheniramine-HYDROcodone Amanda Cockayne [facility] ER) 10-8 MG/5ML Latanya Presser [[id number] He is wondering if you can call in some more or does he need to be seen again?

## 2015-03-16 MED ORDER — HYDROCOD POLST-CPM POLST ER 10-8 MG/5ML PO SUER: 5.0000 mL | Freq: Two times a day (BID) | ORAL | Status: DC | PRN

## 2015-03-16 NOTE — Telephone Encounter (Signed)
Please advise 

## 2015-03-16 NOTE — Telephone Encounter (Signed)
Pt informed

## 2015-03-16 NOTE — Telephone Encounter (Signed)
Done

## 2015-03-16 NOTE — Telephone Encounter (Signed)
Attempting to call pt to let know of sript at front

## 2015-03-23 ENCOUNTER — Encounter: Payer: Self-pay | Admitting: Internal Medicine

## 2015-03-23 ENCOUNTER — Ambulatory Visit (INDEPENDENT_AMBULATORY_CARE_PROVIDER_SITE_OTHER): Payer: BC Managed Care – PPO | Admitting: Internal Medicine

## 2015-03-23 VITALS — BP 130/80 | HR 75 | Temp 98.7°F | Resp 20 | Wt 230.5 lb

## 2015-03-23 DIAGNOSIS — I10 Essential (primary) hypertension: Secondary | ICD-10-CM

## 2015-03-23 DIAGNOSIS — M722 Plantar fascial fibromatosis: Secondary | ICD-10-CM

## 2015-03-23 DIAGNOSIS — R739 Hyperglycemia, unspecified: Secondary | ICD-10-CM

## 2015-03-23 MED ORDER — NAPROXEN 500 MG PO TABS: 500.0000 mg | ORAL_TABLET | Freq: Two times a day (BID) | ORAL | Status: DC

## 2015-03-23 MED ORDER — AMLODIPINE-OLMESARTAN 10-40 MG PO TABS: 1.0000 | ORAL_TABLET | Freq: Every day | ORAL | Status: DC

## 2015-03-23 NOTE — Progress Notes (Signed)
   Subjective:    Patient ID: Brad Hayes, male    DOB: 07/17/1960, 55 y.o.   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  NO  Fever, swelling,  Hx of neuropathy.  Pt denies chest pain, increased sob or doe, wheezing, orthopnea, PND, increased LE swelling, palpitations, dizziness or syncope.  Pt denies new neurological symptoms such as new headache, or facial or extremity weakness or numbness  No LBP or fever, redness or swelling.   Pt denies polydipsia, polyuria,  Past Medical History  Diagnosis Date  . Hypertension   . OSA (obstructive sleep apnea)   . Chronic insomnia    Past Surgical History  Procedure Laterality Date  . Dental extraction      reports that he has never smoked. He has never used smokeless tobacco. He reports that he drinks about 3.0 oz of alcohol per week. He reports that he does not use illicit drugs. family history includes Alcohol abuse in his father; Hypertension in his brother, mother, and sister; Sickle cell anemia in his mother. There is no history of Stroke, Kidney disease, Heart disease, Hyperlipidemia, Early death, Diabetes, COPD, or Cancer. No Known Allergies Current Outpatient Prescriptions on File Prior to Visit  Medication Sig Dispense Refill  . chlorpheniramine-HYDROcodone (TUSSIONEX PENNKINETIC ER) 10-8 MG/5ML SUER Take 5 mLs by mouth every 12 (twelve) hours as needed for cough. 140 mL 0  . hydrochlorothiazide (MICROZIDE) 12.5 MG capsule Take 1 capsule (12.5 mg total) by mouth daily. 90 capsule 1  . mometasone-formoterol (DULERA) 200-5 MCG/ACT AERO Inhale 2 puffs into the lungs 2 (two) times daily. 13 g 11   No current facility-administered medications on file prior to  visit.   Review of Systems  Constitutional: Negative for unusual diaphoresis or night sweats HENT: Negative for ringing in ear or discharge Eyes: Negative for double vision or worsening visual disturbance.  Respiratory: Negative for choking and stridor.   Gastrointestinal: Negative for vomiting or other signifcant bowel change Genitourinary: Negative for hematuria or change in urine volume.  Musculoskeletal: Negative for other MSK pain or swelling Skin: Negative for color change and worsening wound.  Neurological: Negative for tremors and numbness other than noted  Psychiatric/Behavioral: Negative for decreased concentration or agitation other than above       Objective:   Physical Exam BP 130/80 mmHg  Pulse 75  Temp(Src) 98.7 F (37.1 C) (Oral)  Resp 20  Wt 230 lb 8 oz (104.554 kg)  SpO2 93% VS noted,  Constitutional: Pt appears in no significant distress HENT: Head: NCAT.  Right Ear: External ear normal.  Left Ear: External ear normal.  Eyes: . Pupils are equal, round, and reactive to light. Conjunctivae and EOM are normal Neck: Normal range of motion. Neck supple.  Cardiovascular: Normal rate and regular rhythm.   Pulmonary/Chest: Effort normal and breath sounds without rales or wheezing.  Bilat feet WNL neurovasc intact, does have bilat plantar heel and instep tenderness Neurological: Pt is alert. Not confused , motor grossly intact Skin: Skin is warm. No rash, no LE edema Psychiatric: Pt behavior is normal. No agitation.     Assessment & Plan:

## 2015-03-23 NOTE — Progress Notes (Signed)
Pre visit review using our clinic review tool, if applicable. No additional management support is needed unless otherwise documented below in the visit note. 

## 2015-03-23 NOTE — Patient Instructions (Signed)
Please take all new medication as prescribed - the anti-inflammatory medication  Please consider OTC orthotics to help the arch support  Please try heel cushions, or new shoe inserts such as at [employer], and always wear thick soft soled shoes to help take the pressure off the heels when you stand  Please do the stretching excercises you were shown three times per day  If you are not improved in 1-2 wks, please see Dr Tamala Julian (sports medicine) in this office  Please continue all other medications as before, and refills have been done if requested - the generic for Azor  Please have the pharmacy call with any other refills you may need.  Please keep your appointments with your specialists as you may have planned

## 2015-03-25 NOTE — Assessment & Plan Note (Signed)
stable overall by history and exam, recent data reviewed with pt, and pt to continue medical treatment as before,  to f/u any worsening symptoms or concerns Lab Results  Component Value Date   HGBA1C 5.0 03/10/2012

## 2015-03-25 NOTE — Assessment & Plan Note (Signed)
stable overall by history and exam, recent data reviewed with pt, and pt to continue medical treatment as before,  to f/u any worsening symptoms or concerns BP Readings from Last 3 Encounters:  03/23/15 130/80  02/13/15 118/82  02/01/15 150/98

## 2015-03-25 NOTE — Assessment & Plan Note (Signed)
Bilateral, mod to severe persistent, for pain control with nsaid and narcotic prn, soft soled shoes, OTC orthotics, avoid standing long periods of time, stretching exercises, shoe inserts or heel cushions, considier sport med referral for cortisone but declines for now

## 2015-05-17 ENCOUNTER — Encounter: Payer: Self-pay | Admitting: Nurse Practitioner

## 2015-05-17 ENCOUNTER — Ambulatory Visit (INDEPENDENT_AMBULATORY_CARE_PROVIDER_SITE_OTHER): Payer: BC Managed Care – PPO | Admitting: Nurse Practitioner

## 2015-05-17 VITALS — BP 140/92 | HR 80 | Temp 98.1°F | Ht 73.0 in | Wt 231.0 lb

## 2015-05-17 DIAGNOSIS — J069 Acute upper respiratory infection, unspecified: Secondary | ICD-10-CM | POA: Diagnosis not present

## 2015-05-17 MED ORDER — HYDROCODONE-HOMATROPINE 5-1.5 MG/5ML PO SYRP: 5.0000 mL | ORAL_SOLUTION | Freq: Three times a day (TID) | ORAL | Status: DC | PRN

## 2015-05-17 MED ORDER — ALBUTEROL SULFATE HFA 108 (90 BASE) MCG/ACT IN AERS: 2.0000 | INHALATION_SPRAY | Freq: Four times a day (QID) | RESPIRATORY_TRACT | Status: DC | PRN

## 2015-05-17 MED ORDER — AMOXICILLIN-POT CLAVULANATE 875-125 MG PO TABS: 1.0000 | ORAL_TABLET | Freq: Two times a day (BID) | ORAL | Status: DC

## 2015-05-17 NOTE — Patient Instructions (Signed)

## 2015-05-17 NOTE — Progress Notes (Signed)
Pre visit review using our clinic review tool, if applicable. No additional management support is needed unless otherwise documented below in the visit note. 

## 2015-05-17 NOTE — Progress Notes (Signed)
Patient ID: Brad Hayes, male    DOB: [birth year]  Age: 55 y.o. MRN: [id number]  CC: Sore Throat   HPI Brad Hayes presents for ST, congestion, chills ect... X 7 days.   1) ST, chest congestion, subjective fever, chills at night  Nasal drainage- clear/yellow  Dry cough Sick contacts- works in [employer]  Treatment to date:  Ibuprofen  Robitussin  Mucinex Cold/flu OTC medication   History Brad Hayes has a past medical history of Hypertension; OSA (obstructive sleep apnea); and Chronic insomnia.   Brad Hayes has past surgical history that includes dental extraction.   His family history includes Alcohol abuse in his father; Hypertension in his brother, mother, and sister; Sickle cell anemia in his mother. There is no history of Stroke, Kidney disease, Heart disease, Hyperlipidemia, Early death, Diabetes, COPD, or Cancer.Brad Hayes reports that Brad Hayes has never smoked. Brad Hayes has never used smokeless tobacco. Brad Hayes reports that Brad Hayes drinks about 3.0 oz of alcohol per week. Brad Hayes reports that Brad Hayes does not use illicit drugs.  Outpatient Prescriptions Prior to Visit  Medication Sig Dispense Refill  . amLODipine-olmesartan (AZOR) 10-40 MG tablet Take 1 tablet by mouth daily. 90 tablet 1  . mometasone-formoterol (DULERA) 200-5 MCG/ACT AERO Inhale 2 puffs into the lungs 2 (two) times daily. 13 g 11  . naproxen (NAPROSYN) 500 MG tablet Take 1 tablet (500 mg total) by mouth 2 (two) times daily with a meal. As needed for pain 60 tablet 2  . hydrochlorothiazide (MICROZIDE) 12.5 MG capsule Take 1 capsule (12.5 mg total) by mouth daily. (Patient not taking: Reported on 05/17/2015) 90 capsule 1  . chlorpheniramine-HYDROcodone (TUSSIONEX PENNKINETIC ER) 10-8 MG/5ML SUER Take 5 mLs by mouth every 12 (twelve) hours as needed for cough. 140 mL 0   No facility-administered medications prior to visit.    ROS Review of Systems  Constitutional: Positive for fever, chills and fatigue. Negative for diaphoresis.  HENT:  Positive for congestion, ear pain, postnasal drip, rhinorrhea, sinus pressure and sore throat. Negative for ear discharge, sneezing, tinnitus and trouble swallowing.   Eyes: Negative for visual disturbance.  Respiratory: Positive for cough. Negative for chest tightness, shortness of breath and wheezing.   Cardiovascular: Negative for chest pain, palpitations and leg swelling.  Gastrointestinal: Negative for nausea, vomiting and diarrhea.  Neurological: Negative for dizziness, light-headedness and headaches.    Objective:  BP 140/92 mmHg  Pulse 80  Temp(Src) 98.1 F (36.7 C) (Oral)  Ht 6' 1"  (1.854 m)  Wt 231 lb (104.781 kg)  BMI 30.48 kg/m2  SpO2 96%  Physical Exam  Constitutional: Brad Hayes is oriented to person, place, and time. Brad Hayes appears well-developed and well-nourished. No distress.  HENT:  Head: Normocephalic and atraumatic.  Right Ear: External ear normal.  Left Ear: External ear normal.  Mouth/Throat: Oropharynx is clear and moist. No oropharyngeal exudate.  TMs have cerumen wet  Eyes: EOM are normal. Pupils are equal, round, and reactive to light. Right eye exhibits no discharge. Left eye exhibits no discharge. No scleral icterus.  Neck: Normal range of motion. Neck supple.  Cardiovascular: Normal rate, regular rhythm and normal heart sounds.   Pulmonary/Chest: Effort normal and breath sounds normal. No respiratory distress. Brad Hayes has no wheezes. Brad Hayes has no rales. Brad Hayes exhibits no tenderness.  Lymphadenopathy:    Brad Hayes has no cervical adenopathy.  Neurological: Brad Hayes is alert and oriented to person, place, and time.  Skin: Skin is warm and dry. No rash noted. Brad Hayes is not diaphoretic.  Psychiatric: Brad Hayes has  a normal mood and affect. His behavior is normal. Judgment and thought content normal.   Assessment & Plan:   Brad Hayes was seen today for sore throat.  Diagnoses and all orders for this visit:  Acute URI  Other orders -     HYDROcodone-homatropine (HYCODAN) 5-1.5 MG/5ML syrup; Take 5  mLs by mouth every 8 (eight) hours as needed for cough. -     amoxicillin-clavulanate (AUGMENTIN) 875-125 MG tablet; Take 1 tablet by mouth 2 (two) times daily. -     albuterol (PROVENTIL HFA;VENTOLIN HFA) 108 (90 Base) MCG/ACT inhaler; Inhale 2 puffs into the lungs every 6 (six) hours as needed for wheezing or shortness of breath.   I have discontinued Brad Hayes chlorpheniramine-HYDROcodone. I am also having him start on HYDROcodone-homatropine, amoxicillin-clavulanate, and albuterol. Additionally, I am having him maintain his hydrochlorothiazide, mometasone-formoterol, amLODipine-olmesartan, and naproxen.  Meds ordered this encounter  Medications  . HYDROcodone-homatropine (HYCODAN) 5-1.5 MG/5ML syrup    Sig: Take 5 mLs by mouth every 8 (eight) hours as needed for cough.    Dispense:  120 mL    Refill:  0    Order Specific Question:  Supervising Provider    Answer:  Deborra Medina L [[id number]]  . amoxicillin-clavulanate (AUGMENTIN) 875-125 MG tablet    Sig: Take 1 tablet by mouth 2 (two) times daily.    Dispense:  14 tablet    Refill:  0    Order Specific Question:  Supervising Provider    Answer:  Deborra Medina L [[id number]]  . albuterol (PROVENTIL HFA;VENTOLIN HFA) 108 (90 Base) MCG/ACT inhaler    Sig: Inhale 2 puffs into the lungs every 6 (six) hours as needed for wheezing or shortness of breath.    Dispense:  1 Inhaler    Refill:  2    Product selection permitted for cost    Order Specific Question:  Supervising Provider    Answer:  Crecencio Mc [[id number]]     Follow-up: Return if symptoms worsen or fail to improve.

## 2015-05-17 NOTE — Assessment & Plan Note (Signed)
Acute URI vs. Sinusitis  Augmentin, ventolin HFA sent to pharmacy  Hycodan syrup printed, signed, and given to pt to take to pharmacy  Cautioned on drowsy effects and how much to take FU prn worsening/failure to improve.

## 2015-05-23 ENCOUNTER — Institutional Professional Consult (permissible substitution): Payer: BC Managed Care – PPO | Admitting: Pulmonary Disease

## 2015-06-28 ENCOUNTER — Encounter: Payer: Self-pay | Admitting: Family

## 2015-06-28 ENCOUNTER — Ambulatory Visit (INDEPENDENT_AMBULATORY_CARE_PROVIDER_SITE_OTHER): Payer: BC Managed Care – PPO | Admitting: Family

## 2015-06-28 VITALS — BP 110/70 | HR 89 | Temp 98.7°F | Ht 73.0 in | Wt 229.5 lb

## 2015-06-28 DIAGNOSIS — R079 Chest pain, unspecified: Secondary | ICD-10-CM

## 2015-06-28 DIAGNOSIS — R059 Cough, unspecified: Secondary | ICD-10-CM

## 2015-06-28 DIAGNOSIS — J45909 Unspecified asthma, uncomplicated: Secondary | ICD-10-CM | POA: Diagnosis not present

## 2015-06-28 DIAGNOSIS — R05 Cough: Secondary | ICD-10-CM

## 2015-06-28 MED ORDER — CETIRIZINE HCL 10 MG PO TABS: 10.0000 mg | ORAL_TABLET | Freq: Every day | ORAL | Status: DC

## 2015-06-28 MED ORDER — PREDNISONE 10 MG PO TABS: ORAL_TABLET | ORAL | Status: DC

## 2015-06-28 NOTE — Progress Notes (Signed)
Subjective:    Patient ID: Brad Hayes, male    DOB: [birth year], 55 y.o.   MRN: [id number]   Brad Hayes is a 55 y.o. male who presents today for an acute visit.    HPI Comments: Recurrent cough. Cough resolved in March with antibiotic and inhaler.   Seasonal allergies; out of Zyrtec which helped with productive cough, wheezing.   No h/o GERD, asthma. Not a smoker.  Of note, Patient also describes remote h/o of chest tightness on left side occasionally. No pain recently. Denies exertional chest pain or pressure, numbness or tingling radiating to left arm or jaw, palpitations, dizziness, frequent headaches, changes in vision, or shortness of breath.  No family h/o heart disease of cardiac death.      Cough This is a recurrent problem. The current episode started more than 1 month ago. The problem has been waxing and waning. The problem occurs every few hours. The cough is non-productive. Associated symptoms include postnasal drip and rhinorrhea. Pertinent negatives include no chest pain, chills, ear pain, fever, headaches, myalgias, sore throat, shortness of breath or wheezing. The symptoms are aggravated by lying down and pollens. He has tried a beta-agonist inhaler for the symptoms. The treatment provided mild relief. His past medical history is significant for bronchitis and pneumonia. recently treated for URI 3/17 treated with augmentin, albuterol, hycodan   Past Medical History  Diagnosis Date  . Hypertension   . OSA (obstructive sleep apnea)   . Chronic insomnia    Review of patient's allergies indicates no known allergies. Current Outpatient Prescriptions on File Prior to Visit  Medication Sig Dispense Refill  . amLODipine-olmesartan (AZOR) 10-40 MG tablet Take 1 tablet by mouth daily. 90 tablet 1  . naproxen (NAPROSYN) 500 MG tablet Take 1 tablet (500 mg total) by mouth 2 (two) times daily with a meal. As needed for pain 60 tablet 2  . albuterol (PROVENTIL  HFA;VENTOLIN HFA) 108 (90 Base) MCG/ACT inhaler Inhale 2 puffs into the lungs every 6 (six) hours as needed for wheezing or shortness of breath. (Patient not taking: Reported on 06/28/2015) 1 Inhaler 2  . hydrochlorothiazide (MICROZIDE) 12.5 MG capsule Take 1 capsule (12.5 mg total) by mouth daily. (Patient not taking: Reported on 06/28/2015) 90 capsule 1  . mometasone-formoterol (DULERA) 200-5 MCG/ACT AERO Inhale 2 puffs into the lungs 2 (two) times daily. (Patient not taking: Reported on 06/28/2015) 13 g 11   No current facility-administered medications on file prior to visit.    Social History  Substance Use Topics  . Smoking status: Never Smoker   . Smokeless tobacco: Never Used  . Alcohol Use: 3.0 oz/week    5 Cans of beer per week    Review of Systems  Constitutional: Negative for fever and chills.  HENT: Positive for postnasal drip and rhinorrhea. Negative for congestion, ear pain, sinus pressure and sore throat.   Eyes: Positive for itching.  Respiratory: Positive for cough. Negative for shortness of breath and wheezing.   Cardiovascular: Negative for chest pain and palpitations.  Gastrointestinal: Negative for nausea, vomiting and diarrhea.  Musculoskeletal: Negative for myalgias.  Neurological: Negative for dizziness and headaches.      Objective:    BP 110/70 mmHg  Pulse 89  Temp(Src) 98.7 F (37.1 C) (Oral)  Ht 6' 1"  (1.854 m)  Wt 229 lb 8 oz (104.101 kg)  BMI 30.29 kg/m2  SpO2 96%   Physical Exam  Constitutional: Vital signs are normal. He appears well-developed and  well-nourished.  HENT:  Head: Normocephalic and atraumatic.  Right Ear: Hearing, tympanic membrane, external ear and ear canal normal. No drainage, swelling or tenderness. Tympanic membrane is not injected, not erythematous and not bulging. No middle ear effusion. No decreased hearing is noted.  Left Ear: Hearing, tympanic membrane, external ear and ear canal normal. No drainage, swelling or tenderness.  Tympanic membrane is not injected, not erythematous and not bulging.  No middle ear effusion. No decreased hearing is noted.  Nose: Rhinorrhea present. Right sinus exhibits no maxillary sinus tenderness and no frontal sinus tenderness. Left sinus exhibits no maxillary sinus tenderness and no frontal sinus tenderness.  Mouth/Throat: Uvula is midline, oropharynx is clear and moist and mucous membranes are normal. No oropharyngeal exudate, posterior oropharyngeal edema, posterior oropharyngeal erythema or tonsillar abscesses.  Eyes: Conjunctivae are normal.  Cardiovascular: Regular rhythm and normal heart sounds.   No LE edema.   Pulmonary/Chest: Effort normal and breath sounds normal. No respiratory distress. He has no wheezes. He has no rhonchi. He has no rales.  Lymphadenopathy:       Head (right side): No submental, no submandibular, no tonsillar, no preauricular, no posterior auricular and no occipital adenopathy present.       Head (left side): No submental, no submandibular, no tonsillar, no preauricular, no posterior auricular and no occipital adenopathy present.    He has no cervical adenopathy.  Neurological: He is alert.  Skin: Skin is warm and dry.  Psychiatric: He has a normal mood and affect. His speech is normal and behavior is normal.  Vitals reviewed.      Assessment & Plan:   1. Chest pain, unspecified chest pain type Patient is not having active chest pain tonight. I  am concern that patient is experiencing this occasional chest pain during activity as well as rest. It may be angina and he would need stress test, cardio angiography. I'm referring him to cardiology for additional evaluation.  EKG shows NSR. Rate 85. No signs of ischemia.   -referral to cardiology -EKG  2. Cough Working diagnosis for recurrent cough/bronchospasm is seasonal allergies .Cough is nonproductive and worse at night supporting asthma exacerbation. Afebrile. No acute respiratory distress.    I  have discontinued Mr. Bellucci HYDROcodone-homatropine and amoxicillin-clavulanate. I am also having him maintain his hydrochlorothiazide, mometasone-formoterol, amLODipine-olmesartan, naproxen, and albuterol.   No orders of the defined types were placed in this encounter.    Start medications as prescribed and explained to patient on After Visit Summary ( AVS). Risks, benefits, and alternatives of the medications and treatment plan prescribed today were discussed, and patient expressed understanding.   Education regarding symptom management and diagnosis given to patient.   Follow-up:Plan follow-up as discussed or as needed if any worsening symptoms or change in condition. No Follow-up on file.   Continue to follow with Scarlette Calico, MD for routine health maintenance.   Brad Hayes and I agreed with plan.   Mable Paris, FNP

## 2015-06-28 NOTE — Progress Notes (Signed)
Pre visit review using our clinic review tool, if applicable. No additional management support is needed unless otherwise documented below in the visit note. 

## 2015-06-28 NOTE — Patient Instructions (Addendum)
Referral to cardiology.  Suspect cough is related to asthma with seasonal allergies being trigger. Prednisone taper. Continue use of inhaler. Advise not to use cough syrup.    If there is no improvement in your symptoms, or if there is any worsening of symptoms, or if you have any additional concerns, please return for re-evaluation; or, if we are closed, consider going to the Emergency Room for evaluation if symptoms urgent.  Asthma Attack Prevention While you may not be able to control the fact that you have asthma, you can take actions to prevent asthma attacks. The best way to prevent asthma attacks is to maintain good control of your asthma. You can achieve this by:  Taking your medicines as directed.  Avoiding things that can irritate your airways or make your asthma symptoms worse (asthma triggers).  Keeping track of how well your asthma is controlled and of any changes in your symptoms.  Responding quickly to worsening asthma symptoms (asthma attack).  Seeking emergency care when it is needed. WHAT ARE SOME WAYS TO PREVENT AN ASTHMA ATTACK? Have a Plan Work with your health care provider to create a written plan for managing and treating your asthma attacks (asthma action plan). This plan includes:  A list of your asthma triggers and how you can avoid them.  Information on when medicines should be taken and when their dosages should be changed.  The use of a device that measures how well your lungs are working (peak flow meter). Monitor Your Asthma Use your peak flow meter and record your results in a journal every day. A drop in your peak flow numbers on one or more days may indicate the start of an asthma attack. This can happen even before you start to feel symptoms. You can prevent an asthma attack from getting worse by following the steps in your asthma action plan. Avoid Asthma Triggers Work with your asthma health care provider to find out what your asthma triggers are.  This can be done by:  Allergy testing.  Keeping a journal that notes when asthma attacks occur and the factors that may have contributed to them.  Determining if there are other medical conditions that are making your asthma worse. Once you have determined your asthma triggers, take steps to avoid them. This may include avoiding excessive or prolonged exposure to:  Dust. Have someone dust and vacuum your home for you once or twice a week. Using a high-efficiency particulate arrestance (HEPA) vacuum is best.  Smoke. This includes campfire smoke, forest fire smoke, and secondhand smoke from tobacco products.  Pet dander. Avoid contact with animals that you know you are allergic to.  Allergens from trees, grasses or pollens. Avoid spending a lot of time outdoors when pollen counts are high, and on very windy days.  Very cold, dry, or humid air.  Mold.  Foods that contain high amounts of sulfites.  Strong odors.  Outdoor air pollutants, such as Lexic[employer].  Indoor air pollutants, such as aerosol sprays and fumes from household cleaners.  Household pests, including dust mites and cockroaches, and pest droppings.  Certain medicines, including NSAIDs. Always talk to your health care provider before stopping or starting any new medicines. Medicines Take over-the-counter and prescription medicines only as told by your health care provider. Many asthma attacks can be prevented by carefully following your medicine schedule. Taking your medicines correctly is especially important when you cannot avoid certain asthma triggers. Act Quickly If an asthma attack does happen, acting  quickly can decrease how severe it is and how long it lasts. Take these steps:   Pay attention to your symptoms. If you are coughing, wheezing, or having difficulty breathing, do not wait to see if your symptoms go away on their own. Follow your asthma action plan.  If you have followed your asthma action plan  and your symptoms are not improving, call your health care provider or seek immediate medical care at the nearest hospital. It is important to note how often you need to use your fast-acting rescue inhaler. If you are using your rescue inhaler more often, it may mean that your asthma is not under control. Adjusting your asthma treatment plan may help you to prevent future asthma attacks and help you to gain better control of your condition. HOW CAN I PREVENT AN ASTHMA ATTACK WHEN I EXERCISE? Follow advice from your health care provider about whether you should use your fast-acting inhaler before exercising. Many people with asthma experience exercise-induced bronchoconstriction (EIB). This condition often worsens during vigorous exercise in cold, humid, or dry environments. Usually, people with EIB can stay very active by pre-treating with a fast-acting inhaler before exercising.   This information is not intended to replace advice given to you by your health care provider. Make sure you discuss any questions you have with your health care provider.   Document Released: 01/30/2009 Document Revised: 11/02/2014 Document Reviewed: 07/14/2014 Elsevier Interactive Patient Education 2016 [place].  Heart Attack A heart attack (myocardial infarction, MI) causes damage to the heart that cannot be fixed. A heart attack often happens when a blood clot or other blockage cuts blood flow to the heart. When this happens, certain areas of the heart begin to die. This causes the pain you feel during a heart attack. HOME CARE  Take medicine as told by your doctor. You may need medicine to:  Keep your blood from clotting too easily.  Control your blood pressure.  Lower your cholesterol.  Control abnormal heart rhythms.  Change certain behaviors as told by your doctor. This may include:  Quitting smoking.  Being active.  Eating a heart-healthy diet. Ask your doctor for help with this diet.  Keeping  a healthy weight.  Keeping your diabetes under control.  Lessening stress.  Limiting how much alcohol you drink. Do not take these medicines unless your doctor says that you can:  Nonsteroidal anti-inflammatory drugs (NSAIDs). These include:  Ibuprofen.  Naproxen.  Celecoxib.  Vitamin supplements that have vitamin A, vitamin E, or both.  Hormone therapy that contains estrogen with or without progestin. GET HELP RIGHT AWAY IF:  You have sudden chest discomfort.  You have sudden discomfort in your:  Arms.  Back.  Neck.  Jaw.  You have shortness of breath at any time.  You have sudden sweating or clammy skin.  You feel sick to your stomach (nauseous) or throw up (vomit).  You suddenly get light-headed or dizzy.  You feel your heart beating fast or skipping beats. These symptoms may be an emergency. Do not wait to see if the symptoms will go away. Get medical help right away. Call your local emergency services (911 in the [place] Do not drive yourself to the hospital.   This information is not intended to replace advice given to you by your health care provider. Make sure you discuss any questions you have with your health care provider.   Document Released: 08/13/2011 Document Revised: 06/28/2014 Document Reviewed: 04/16/2013 Elsevier Interactive Patient Education 2016 [employer]

## 2015-08-03 ENCOUNTER — Encounter: Payer: BC Managed Care – PPO | Admitting: Cardiovascular Disease

## 2015-08-03 NOTE — Progress Notes (Signed)
No show

## 2015-08-09 ENCOUNTER — Encounter: Payer: Self-pay | Admitting: Cardiovascular Disease

## 2015-10-20 ENCOUNTER — Other Ambulatory Visit: Payer: Self-pay | Admitting: Internal Medicine

## 2015-10-20 DIAGNOSIS — I10 Essential (primary) hypertension: Secondary | ICD-10-CM

## 2015-11-11 ENCOUNTER — Emergency Department (HOSPITAL_COMMUNITY)
Admission: EM | Admit: 2015-11-11 | Discharge: 2015-11-11 | Disposition: A | Discharge status: Home / Self Care | Payer: BC Managed Care – PPO | Attending: Emergency Medicine | Admitting: Emergency Medicine

## 2015-11-11 ENCOUNTER — Encounter (HOSPITAL_COMMUNITY): Payer: Self-pay

## 2015-11-11 ENCOUNTER — Emergency Department (HOSPITAL_COMMUNITY): Payer: BC Managed Care – PPO

## 2015-11-11 ENCOUNTER — Other Ambulatory Visit: Payer: Self-pay

## 2015-11-11 DIAGNOSIS — R079 Chest pain, unspecified: Secondary | ICD-10-CM

## 2015-11-11 DIAGNOSIS — I77810 Thoracic aortic ectasia: Secondary | ICD-10-CM | POA: Diagnosis not present

## 2015-11-11 DIAGNOSIS — J45909 Unspecified asthma, uncomplicated: Secondary | ICD-10-CM | POA: Insufficient documentation

## 2015-11-11 DIAGNOSIS — Z79899 Other long term (current) drug therapy: Secondary | ICD-10-CM | POA: Diagnosis not present

## 2015-11-11 DIAGNOSIS — R0789 Other chest pain: Secondary | ICD-10-CM | POA: Diagnosis present

## 2015-11-11 DIAGNOSIS — I10 Essential (primary) hypertension: Secondary | ICD-10-CM | POA: Diagnosis not present

## 2015-11-11 DIAGNOSIS — I77819 Aortic ectasia, unspecified site: Secondary | ICD-10-CM

## 2015-11-11 LAB — I-STAT TROPONIN, ED: TROPONIN I, POC: 0 ng/mL (ref 0.00–0.08)

## 2015-11-11 LAB — BASIC METABOLIC PANEL
Anion gap: 7 (ref 5–15)
BUN: 17 mg/dL (ref 6–20)
CALCIUM: 9.5 mg/dL (ref 8.9–10.3)
CO2: 26 mmol/L (ref 22–32)
Chloride: 107 mmol/L (ref 101–111)
Creatinine, Ser: 1.3 mg/dL — ABNORMAL HIGH (ref 0.61–1.24)
GFR calc Af Amer: >60 mL/min (ref >60)
GLUCOSE: 125 mg/dL — AB (ref 65–99)
Potassium: 4.8 mmol/L (ref 3.5–5.1)
Sodium: 140 mmol/L (ref 135–145)

## 2015-11-11 LAB — CBC
HEMATOCRIT: 39.2 % (ref 39.0–52.0)
Hemoglobin: 13.1 g/dL (ref 13.0–17.0)
MCH: 29.2 pg (ref 26.0–34.0)
MCHC: 33.4 g/dL (ref 30.0–36.0)
MCV: 87.3 fL (ref 78.0–100.0)
Platelets: 235 10*3/uL (ref 150–400)
RBC: 4.49 MIL/uL (ref 4.22–5.81)
RDW: 12.2 % (ref 11.5–15.5)
WBC: 6.2 10*3/uL (ref 4.0–10.5)

## 2015-11-11 LAB — D-DIMER, QUANTITATIVE: D-Dimer, Quant: 0.28 ug/mL-FEU (ref 0.00–0.50)

## 2015-11-11 LAB — TROPONIN I: Troponin I: <0.03 ng/mL (ref <0.03)

## 2015-11-11 MED ORDER — OXYCODONE-ACETAMINOPHEN 5-325 MG PO TABS: 2.0000 | ORAL_TABLET | Freq: Once | ORAL | Status: DC

## 2015-11-11 MED ORDER — CYCLOBENZAPRINE HCL 10 MG PO TABS: 10.0000 mg | ORAL_TABLET | Freq: Three times a day (TID) | ORAL | 0 refills | Status: DC | PRN

## 2015-11-11 MED ORDER — IOPAMIDOL (ISOVUE-370) INJECTION 76%
INTRAVENOUS | Status: AC
  Administered 2015-11-11: 100 mL
  Filled 2015-11-11: qty 100

## 2015-11-11 MED ORDER — MORPHINE SULFATE (PF) 4 MG/ML IV SOLN
4.0000 mg | Freq: Once | INTRAVENOUS | Status: AC
  Administered 2015-11-11: 4 mg via INTRAVENOUS
  Filled 2015-11-11: qty 1

## 2015-11-11 MED ORDER — HYDROCODONE-ACETAMINOPHEN 5-325 MG PO TABS
1.0000 | ORAL_TABLET | Freq: Once | ORAL | Status: AC
  Administered 2015-11-11: 1 via ORAL
  Filled 2015-11-11: qty 1

## 2015-11-11 MED ORDER — METHOCARBAMOL 1000 MG/10ML IJ SOLN: 1000.0000 mg | Freq: Once | INTRAMUSCULAR | Status: DC

## 2015-11-11 MED ORDER — HYDROMORPHONE HCL 1 MG/ML IJ SOLN
1.0000 mg | Freq: Once | INTRAMUSCULAR | Status: AC
  Administered 2015-11-11: 1 mg via INTRAVENOUS
  Filled 2015-11-11: qty 1

## 2015-11-11 MED ORDER — HYDROCODONE-ACETAMINOPHEN 5-325 MG PO TABS: 1.0000 | ORAL_TABLET | Freq: Four times a day (QID) | ORAL | 0 refills | Status: DC | PRN

## 2015-11-11 MED ORDER — METHOCARBAMOL 500 MG PO TABS
1000.0000 mg | ORAL_TABLET | Freq: Once | ORAL | Status: AC
  Administered 2015-11-11: 1000 mg via ORAL
  Filled 2015-11-11: qty 2

## 2015-11-11 MED ORDER — NAPROXEN 500 MG PO TABS: 500.0000 mg | ORAL_TABLET | Freq: Two times a day (BID) | ORAL | 0 refills | Status: AC

## 2015-11-11 MED ORDER — KETOROLAC TROMETHAMINE 30 MG/ML IJ SOLN
15.0000 mg | Freq: Once | INTRAMUSCULAR | Status: AC
  Administered 2015-11-11: 15 mg via INTRAVENOUS
  Filled 2015-11-11: qty 1

## 2015-11-11 NOTE — ED Triage Notes (Signed)
Pt reports sharp left side chest pain that started about 20 minutes ago. Pt has no cardiac hx. Hx of HTN. Pt reports no associated symptoms at this time, no distress noted.

## 2015-11-11 NOTE — ED Provider Notes (Signed)
[facility] DEPT Provider Note   CSN: [id number] Arrival date & time: 11/11/15  1217     History   Chief Complaint Chief Complaint  Patient presents with  . Chest Pain    HPI Brad Hayes is a 55 y.o. male.  HPI 55 year old male with past medical history of hypertension who presents with acute onset of sharp back pain. The patient states he fell sleep on the couch last night. Upon awakening this morning and trying to get up. He expressed acute onset of sharp, tearing back pain. He states the pain feels like a cramp in his back and is worse with positions, but also radiates to his chest. He had associated mild shortness of breath but no nausea, vomiting or diaphoresis. His pain worsens with position changes but not associated exertion. It does not resolve with rest. He has no history of coronary artery disease. No family history of coronary disease. Denies any vision changes, numbness or weakness. Denies any history of DVTs or PE.  Past Medical History:  Diagnosis Date  . Chronic insomnia   . Hypertension   . OSA (obstructive sleep apnea)     Patient Active Problem List   Diagnosis Date Noted  . Acute URI 05/17/2015  . Plantar fasciitis, bilateral 03/23/2015  . RML pneumonia 02/01/2015  . Asthma with exacerbation 02/01/2015  . Allergic rhinitis 06/23/2014  . Hyperglycemia 03/10/2012  . Routine general medical examination at a health care facility 03/10/2012  . Low back pain radiating to both legs 09/20/2011  . INSOMNIA-SLEEP DISORDER-UNSPEC 03/21/2010  . Sleep apnea 03/21/2010  . ABNORMAL ELECTROCARDIOGRAM 03/21/2010  . ERECTILE DYSFUNCTION 03/22/2009  . Essential hypertension, benign 03/22/2009    Past Surgical History:  Procedure Laterality Date  . dental extraction         Home Medications    Prior to Admission medications   Medication Sig Start Date End Date Taking? Authorizing Provider  amLODipine-olmesartan (AZOR) 10-40 MG tablet TAKE 1 TABLET BY  MOUTH DAILY 10/23/15  Yes Biagio Borg, MD  cetirizine (ZYRTEC) 10 MG tablet Take 1 tablet (10 mg total) by mouth daily. Patient taking differently: Take 10 mg by mouth daily as needed for allergies.  06/28/15  Yes Burnard Hawthorne, FNP  mometasone-formoterol (DULERA) 200-5 MCG/ACT AERO Inhale 2 puffs into the lungs 2 (two) times daily. 02/01/15  Yes Janith Lima, MD  naproxen (NAPROSYN) 500 MG tablet Take 1 tablet (500 mg total) by mouth 2 (two) times daily with a meal. As needed for pain Patient taking differently: Take 500 mg by mouth daily as needed for mild pain.  03/23/15  Yes Biagio Borg, MD  cyclobenzaprine (FLEXERIL) 10 MG tablet Take 1 tablet (10 mg total) by mouth 3 (three) times daily as needed for muscle spasms. 11/11/15   Duffy Bruce, MD  hydrochlorothiazide (MICROZIDE) 12.5 MG capsule Take 1 capsule (12.5 mg total) by mouth daily. Patient not taking: Reported on 11/11/2015 02/01/15   Janith Lima, MD  HYDROcodone-acetaminophen (NORCO/VICODIN) 5-325 MG tablet Take 1 tablet by mouth every 6 (six) hours as needed for severe pain. 11/11/15   Duffy Bruce, MD  naproxen (NAPROSYN) 500 MG tablet Take 1 tablet (500 mg total) by mouth 2 (two) times daily. 11/11/15 11/18/15  Duffy Bruce, MD    Family History Family History  Problem Relation Age of Onset  . Hypertension Mother   . Sickle cell anemia Mother   . Alcohol abuse Father   . Hypertension Sister   .  Hypertension Brother   . Stroke Neg Hx   . Kidney disease Neg Hx   . Heart disease Neg Hx   . Hyperlipidemia Neg Hx   . Early death Neg Hx   . Diabetes Neg Hx   . COPD Neg Hx   . Cancer Neg Hx     Social History Social History  Substance Use Topics  . Smoking status: Never Smoker  . Smokeless tobacco: Never Used  . Alcohol use 3.0 oz/week    5 Cans of beer per week     Allergies   Review of patient's allergies indicates no known allergies.   Review of Systems Review of Systems  Constitutional: Negative for  chills, fatigue and fever.  HENT: Negative for congestion and rhinorrhea.   Eyes: Negative for visual disturbance.  Respiratory: Positive for chest tightness and shortness of breath. Negative for cough and wheezing.   Cardiovascular: Positive for chest pain. Negative for leg swelling.  Gastrointestinal: Negative for abdominal pain, diarrhea, nausea and vomiting.  Genitourinary: Negative for dysuria and flank pain.  Musculoskeletal: Negative for neck pain and neck stiffness.  Skin: Negative for rash and wound.  Allergic/Immunologic: Negative for immunocompromised state.  Neurological: Negative for syncope, weakness and headaches.  All other systems reviewed and are negative.    Physical Exam Updated Vital Signs BP 120/75   Pulse 63   Temp 98.2 F (36.8 C) (Oral)   Resp 14   Ht 6' (1.829 m)   Wt 235 lb (106.6 kg)   SpO2 97%   BMI 31.87 kg/m   Physical Exam  Constitutional: He is oriented to person, place, and time. He appears well-developed and well-nourished. No distress.  HENT:  Head: Normocephalic and atraumatic.  Eyes: Conjunctivae are normal.  Neck: Neck supple.  Cardiovascular: Normal rate, regular rhythm and normal heart sounds.  Exam reveals no friction rub.   No murmur heard. Pulmonary/Chest: Effort normal and breath sounds normal. No respiratory distress. He has no wheezes. He has no rales. He exhibits tenderness (Mild tenderness to palpation over left parasternal chest wall).  Abdominal: He exhibits no distension.  Musculoskeletal: He exhibits tenderness (Mild left-sided paraspinal tenderness. No midline tenderness or deformity.). He exhibits no edema.  Neurological: He is alert and oriented to person, place, and time. He exhibits normal muscle tone.  Skin: Skin is warm. Capillary refill takes less than 2 seconds.  Psychiatric: He has a normal mood and affect.  Nursing note and vitals reviewed.    ED Treatments / Results  Labs (all labs ordered are listed, but  only abnormal results are displayed) Labs Reviewed  BASIC METABOLIC PANEL - Abnormal; Notable for the following:       Result Value   Glucose, Bld 125 (*)    Creatinine, Ser 1.30 (*)    All other components within normal limits  CBC  D-DIMER, QUANTITATIVE (NOT AT [facility])  TROPONIN I  I-STAT TROPOININ, ED    EKG  EKG Interpretation  Date/Time:  Saturday November 11 2015 12:23:24 EDT Ventricular Rate:  79 PR Interval:  148 QRS Duration: 82 QT Interval:  350 QTC Calculation: 401 R Axis:   7 Text Interpretation:  Normal sinus rhythm Normal ECG No significant change since last tracing Confirmed by Nyari Olsson MD, Brenley Priore [id number]) on 11/11/2015 5:45:08 PM       Radiology Dg Chest 2 View  Result Date: 11/11/2015 CLINICAL DATA:  Pt c/o left sided chest pains x 45 min. No other chest complaints. Hx of HTN. No  previous sx. Non smoker. EXAM: CHEST  2 VIEW COMPARISON:  02/13/2015 FINDINGS: The heart size and mediastinal contours are within normal limits. Both lungs are clear. No pleural effusion or pneumothorax. The visualized skeletal structures are unremarkable. IMPRESSION: No active cardiopulmonary disease. Electronically Signed   By: Lajean Manes M.D.   On: 11/11/2015 13:11   Ct Angio Chest/abd/pel For Dissection W And/or W/wo  Result Date: 11/11/2015 CLINICAL DATA:  Left-sided chest pain extending to back this morning with no shortness-of-breath. EXAM: CT ANGIOGRAPHY CHEST, ABDOMEN AND PELVIS TECHNIQUE: Multidetector CT imaging through the chest, abdomen and pelvis was performed using the standard protocol during bolus administration of intravenous contrast. Multiplanar reconstructed images including MIPs were obtained and reviewed to evaluate the vascular anatomy. Pre and post-contrast images were obtained. CONTRAST:  100 mL Isovue 370 IV COMPARISON:  Chest x-ray 11/11/2015 FINDINGS: CTA CHEST FINDINGS Mild ectasia of the ascending thoracic aorta measuring 3.5 cm in AP diameter. No evidence of  aneurysm or dissection involving the thoracic aorta. Normal 3 vessel takeoff from the aortic arch. Pulmonary arterial system is within normal. Mild cardiomegaly. Remaining mediastinal structures are within normal. Lungs are well inflated without consolidation or effusion. Minimal linear atelectasis/scarring over the lingula. Airways are within normal. Review of the MIP images confirms the above findings. CTA ABDOMEN AND PELVIS FINDINGS Abdominal aorta is normal caliber without dissection or aneurysm. The celiac axis, superior and inferior mesenteric arteries are patent. Single renal arteries are widely patent. Iliac and visualized femoral arteries are normal. The liver, spleen, pancreas, gallbladder and adrenal glands are within normal. Kidneys are normal in size without hydronephrosis or nephrolithiasis. There are a few bilateral renal cysts. Stomach, small bowel, appendix and colon are within normal. Mesentery is within normal. No adenopathy. Small umbilical hernia containing only peritoneal fat. Pelvic images demonstrate a normal bladder, prostate and rectum. Minimal degenerate change of the spine. Review of the MIP images confirms the above findings. IMPRESSION: CTA CHEST IMPRESSION No evidence of thoracic aortic aneurysm or dissection. No acute findings in the chest. Mild ectasia of the ascending thoracic aorta measuring 3.5 cm. Recommend annual imaging followup by CTA or MRA. This recommendation follows 2010 ACCF/AHA/AATS/ACR/ASA/SCA/SCAI/SIR/STS/SVM Guidelines for the Diagnosis and Management of Patients with Thoracic Aortic Disease. Circulation.2010; 121: V564-P329. CTA ABDOMEN AND PELVIS IMPRESSION No evidence of aneurysm or dissection involving the abdominal aorta. No acute findings in the abdomen/pelvis. Bilateral renal cysts. Small umbilical hernia containing only peritoneal fat. Electronically Signed   By: Marin Olp M.D.   On: 11/11/2015 15:53    Procedures Procedures (including critical care  time)  Medications Ordered in ED Medications  morphine 4 MG/ML injection 4 mg (4 mg Intravenous Given 11/11/15 1308)  methocarbamol (ROBAXIN) tablet 1,000 mg (1,000 mg Oral Given 11/11/15 1307)  ketorolac (TORADOL) 30 MG/ML injection 15 mg (15 mg Intravenous Given 11/11/15 1345)  HYDROmorphone (DILAUDID) injection 1 mg (1 mg Intravenous Given 11/11/15 1546)  iopamidol (ISOVUE-370) 76 % injection (100 mLs  Contrast Given 11/11/15 1516)  HYDROcodone-acetaminophen (NORCO/VICODIN) 5-325 MG per tablet 1 tablet (1 tablet Oral Given 11/11/15 1624)     Initial Impression / Assessment and Plan / ED Course  I have reviewed the triage vital signs and the nursing notes.  Pertinent labs & imaging results that were available during my care of the patient were reviewed by me and considered in my medical decision making (see chart for details).  Clinical Course  55 year old African-American male who presents with sharp, tearing chest and back pain.  He is hypertensive on arrival but otherwise hemodynamically stable. His pain does seem reproducible on examination. Initial primary consideration is musculoskeletal chest wall pain, likely secondary to abnormal sleeping position on the couch last night. However, he is hypertensive and must consider aortic dissection. Less likely is PE. Pulses are symmetric and blood pressure symmetric in bilateral upper extremities. No neurological deficits. No recent cough, sputum production, fevers, or signs of pneumonia or pulmonary etiology. No right upper quadrant tenderness or evidence of cholecystitis.   Initial lab work reviewed as above. Initial troponin is negative. Chest x-ray is clear. D-dimer is negative, making PE less likely. Patient continues to report severe pain at this time despite analgesia. I discussed likely diagnosis of muscular skeletal chest pain, although given his degree of pain and description of pain, I cannot rule out dissection. Based on shared decision  making with the patient, will obtain CTA for further assessment.   CTA of the chest shows no dissection or other abnormality. Of note, he does have a mild thoracic aortic ectasia that has not ruptured were dissected. He will follow up with his PCP. I discussed this with him in detail. Otherwise, delta troponin is negative and heart score is less than 3. Will discharge with likely muscular skeletal chest wall pain, and outpatient follow-up.   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 naproxen (NAPROSYN) 500 MG tablet Take 1 tablet (500 mg total) by mouth 2 (two) times daily., Starting Sat 11/11/2015, Until Sat 11/18/2015, Print     !! - Potential duplicate medications found. Please discuss with provider.       Duffy Bruce, MD 11/11/15 [phone]

## 2015-11-11 NOTE — ED Notes (Signed)
Pt transported to CT ?

## 2016-02-20 ENCOUNTER — Telehealth: Payer: Self-pay | Admitting: Internal Medicine

## 2016-02-20 DIAGNOSIS — I10 Essential (primary) hypertension: Secondary | ICD-10-CM

## 2016-02-20 MED ORDER — AMLODIPINE-OLMESARTAN 10-40 MG PO TABS: 1.0000 | ORAL_TABLET | Freq: Every day | ORAL | 0 refills | Status: DC

## 2016-02-20 NOTE — Telephone Encounter (Signed)
erx for 30 day supply of amlodepine sent to [facility].

## 2016-02-20 NOTE — Telephone Encounter (Signed)
Pt was wondering if we can send him in BP med until he comes in to see Dr. Ronnald Ramp on 03/04/2016. [facility] on [address].

## 2016-03-04 ENCOUNTER — Encounter: Payer: Self-pay | Admitting: Internal Medicine

## 2016-03-04 ENCOUNTER — Other Ambulatory Visit (INDEPENDENT_AMBULATORY_CARE_PROVIDER_SITE_OTHER): Payer: BC Managed Care – PPO

## 2016-03-04 ENCOUNTER — Ambulatory Visit (INDEPENDENT_AMBULATORY_CARE_PROVIDER_SITE_OTHER): Payer: BC Managed Care – PPO | Admitting: Internal Medicine

## 2016-03-04 VITALS — BP 144/90 | HR 109 | Temp 97.9°F | Resp 16 | Ht 72.0 in | Wt 233.8 lb

## 2016-03-04 DIAGNOSIS — G4733 Obstructive sleep apnea (adult) (pediatric): Secondary | ICD-10-CM | POA: Diagnosis not present

## 2016-03-04 DIAGNOSIS — Z0000 Encounter for general adult medical examination without abnormal findings: Secondary | ICD-10-CM

## 2016-03-04 DIAGNOSIS — R9431 Abnormal electrocardiogram [ECG] [EKG]: Secondary | ICD-10-CM | POA: Diagnosis not present

## 2016-03-04 DIAGNOSIS — G4700 Insomnia, unspecified: Secondary | ICD-10-CM

## 2016-03-04 DIAGNOSIS — R739 Hyperglycemia, unspecified: Secondary | ICD-10-CM

## 2016-03-04 DIAGNOSIS — K921 Melena: Secondary | ICD-10-CM

## 2016-03-04 DIAGNOSIS — Z0001 Encounter for general adult medical examination with abnormal findings: Secondary | ICD-10-CM

## 2016-03-04 DIAGNOSIS — I10 Essential (primary) hypertension: Secondary | ICD-10-CM

## 2016-03-04 DIAGNOSIS — G4730 Sleep apnea, unspecified: Secondary | ICD-10-CM

## 2016-03-04 DIAGNOSIS — Z86010 Personal history of colonic polyps: Secondary | ICD-10-CM

## 2016-03-04 LAB — URINALYSIS, ROUTINE W REFLEX MICROSCOPIC
BILIRUBIN URINE: NEGATIVE
HGB URINE DIPSTICK: NEGATIVE
Ketones, ur: NEGATIVE
Leukocytes, UA: NEGATIVE
NITRITE: NEGATIVE
PH: 5.5 (ref 5.0–8.0)
RBC / HPF: NONE SEEN (ref 0–?)
Specific Gravity, Urine: >=1.03 — AB (ref 1.000–1.030)
TOTAL PROTEIN, URINE-UPE24: NEGATIVE
Urine Glucose: NEGATIVE
Urobilinogen, UA: 0.2 (ref 0.0–1.0)
WBC, UA: NONE SEEN (ref 0–?)

## 2016-03-04 LAB — CBC WITH DIFFERENTIAL/PLATELET
BASOS ABS: 0 10*3/uL (ref 0.0–0.1)
BASOS PCT: 0.4 % (ref 0.0–3.0)
EOS ABS: 0 10*3/uL (ref 0.0–0.7)
Eosinophils Relative: 1 % (ref 0.0–5.0)
HEMATOCRIT: 39.6 % (ref 39.0–52.0)
Hemoglobin: 13.4 g/dL (ref 13.0–17.0)
LYMPHS ABS: 2.2 10*3/uL (ref 0.7–4.0)
Lymphocytes Relative: 44.3 % (ref 12.0–46.0)
MCHC: 33.9 g/dL (ref 30.0–36.0)
MCV: 86.1 fl (ref 78.0–100.0)
MONO ABS: 0.5 10*3/uL (ref 0.1–1.0)
Monocytes Relative: 9.7 % (ref 3.0–12.0)
NEUTROS ABS: 2.3 10*3/uL (ref 1.4–7.7)
NEUTROS PCT: 44.6 % (ref 43.0–77.0)
PLATELETS: 261 10*3/uL (ref 150.0–400.0)
RBC: 4.6 Mil/uL (ref 4.22–5.81)
RDW: 13.4 % (ref 11.5–15.5)
WBC: 5.1 10*3/uL (ref 4.0–10.5)

## 2016-03-04 LAB — COMPREHENSIVE METABOLIC PANEL
ALT: 22 U/L (ref 0–53)
AST: 17 U/L (ref 0–37)
Albumin: 4.7 g/dL (ref 3.5–5.2)
Alkaline Phosphatase: 59 U/L (ref 39–117)
BILIRUBIN TOTAL: 0.8 mg/dL (ref 0.2–1.2)
BUN: 21 mg/dL (ref 6–23)
CALCIUM: 9.6 mg/dL (ref 8.4–10.5)
CHLORIDE: 104 meq/L (ref 96–112)
CO2: 28 meq/L (ref 19–32)
CREATININE: 1.08 mg/dL (ref 0.40–1.50)
GFR: 90.88 mL/min (ref >60.00)
GLUCOSE: 109 mg/dL — AB (ref 70–99)
Potassium: 4.5 mEq/L (ref 3.5–5.1)
SODIUM: 139 meq/L (ref 135–145)
Total Protein: 7.6 g/dL (ref 6.0–8.3)

## 2016-03-04 LAB — HEMOGLOBIN A1C: Hgb A1c MFr Bld: 4.7 % (ref 4.6–6.5)

## 2016-03-04 LAB — PSA: PSA: 0.89 ng/mL (ref 0.10–4.00)

## 2016-03-04 LAB — LIPID PANEL
CHOL/HDL RATIO: 3
Cholesterol: 141 mg/dL (ref 0–200)
HDL: 45.4 mg/dL (ref >39.00)
LDL CALC: 82 mg/dL (ref 0–99)
NONHDL: 95.66
TRIGLYCERIDES: 66 mg/dL (ref 0.0–149.0)
VLDL: 13.2 mg/dL (ref 0.0–40.0)

## 2016-03-04 LAB — TSH: TSH: 1.14 u[IU]/mL (ref 0.35–4.50)

## 2016-03-04 LAB — HEPATITIS C ANTIBODY: HCV AB: NEGATIVE

## 2016-03-04 MED ORDER — AMLODIPINE-OLMESARTAN 10-40 MG PO TABS: 1.0000 | ORAL_TABLET | Freq: Every day | ORAL | 1 refills | Status: DC

## 2016-03-04 MED ORDER — ESZOPICLONE 2 MG PO TABS: 2.0000 mg | ORAL_TABLET | Freq: Every evening | ORAL | 1 refills | Status: DC | PRN

## 2016-03-04 NOTE — Progress Notes (Signed)
Pre visit review using our clinic review tool, if applicable. No additional management support is needed unless otherwise documented below in the visit note. 

## 2016-03-04 NOTE — Patient Instructions (Signed)
Hypertension Hypertension, commonly called high blood pressure, is when the force of blood pumping through your arteries is too strong. Your arteries are the blood vessels that carry blood from your heart throughout your body. A blood pressure reading consists of a higher number over a lower number, such as 110/72. The higher number (systolic) is the pressure inside your arteries when your heart pumps. The lower number (diastolic) is the pressure inside your arteries when your heart relaxes. Ideally you want your blood pressure below 120/80. Hypertension forces your heart to work harder to pump blood. Your arteries may become narrow or stiff. Having untreated or uncontrolled hypertension can cause heart attack, stroke, kidney disease, and other problems. What increases the risk? Some risk factors for high blood pressure are controllable. Others are not. Risk factors you cannot control include:  Race. You may be at higher risk if you are African American.  Age. Risk increases with age.  Gender. Men are at higher risk than women before age 45 years. After age 65, women are at higher risk than men. Risk factors you can control include:  Not getting enough exercise or physical activity.  Being overweight.  Getting too much fat, sugar, calories, or salt in your diet.  Drinking too much alcohol. What are the signs or symptoms? Hypertension does not usually cause signs or symptoms. Extremely high blood pressure (hypertensive crisis) may cause headache, anxiety, shortness of breath, and nosebleed. How is this diagnosed? To check if you have hypertension, your health care provider will measure your blood pressure while you are seated, with your arm held at the level of your heart. It should be measured at least twice using the same arm. Certain conditions can cause a difference in blood pressure between your right and left arms. A blood pressure reading that is higher than normal on one occasion does  not mean that you need treatment. If it is not clear whether you have high blood pressure, you may be asked to return on a different day to have your blood pressure checked again. Or, you may be asked to monitor your blood pressure at home for 1 or more weeks. How is this treated? Treating high blood pressure includes making lifestyle changes and possibly taking medicine. Living a healthy lifestyle can help lower high blood pressure. You may need to change some of your habits. Lifestyle changes may include:  Following the DASH diet. This diet is high in fruits, vegetables, and whole grains. It is low in salt, red meat, and added sugars.  Keep your sodium intake below 2,300 mg per day.  Getting at least 30-45 minutes of aerobic exercise at least 4 times per week.  Losing weight if necessary.  Not smoking.  Limiting alcoholic beverages.  Learning ways to reduce stress. Your health care provider may prescribe medicine if lifestyle changes are not enough to get your blood pressure under control, and if one of the following is true:  You are 18-59 years of age and your systolic blood pressure is above 140.  You are 60 years of age or older, and your systolic blood pressure is above 150.  Your diastolic blood pressure is above 90.  You have diabetes, and your systolic blood pressure is over 140 or your diastolic blood pressure is over 90.  You have kidney disease and your blood pressure is above 140/90.  You have heart disease and your blood pressure is above 140/90. Your personal target blood pressure may vary depending on your medical   conditions, your age, and other factors. Follow these instructions at home:  Have your blood pressure rechecked as directed by your health care provider.  Take medicines only as directed by your health care provider. Follow the directions carefully. Blood pressure medicines must be taken as prescribed. The medicine does not work as well when you skip  doses. Skipping doses also puts you at risk for problems.  Do not smoke.  Monitor your blood pressure at home as directed by your health care provider. Contact a health care provider if:  You think you are having a reaction to medicines taken.  You have recurrent headaches or feel dizzy.  You have swelling in your ankles.  You have trouble with your vision. Get help right away if:  You develop a severe headache or confusion.  You have unusual weakness, numbness, or feel faint.  You have severe chest or abdominal pain.  You vomit repeatedly.  You have trouble breathing. This information is not intended to replace advice given to you by your health care provider. Make sure you discuss any questions you have with your health care provider. Document Released: 02/11/2005 Document Revised: 07/20/2015 Document Reviewed: 12/04/2012 Elsevier Interactive Patient Education  2017 [employer]  

## 2016-03-04 NOTE — Progress Notes (Signed)
Subjective:  Patient ID: Yehuda Savannah., male    DOB: 04-Nov-1960  Age: 56 y.o. MRN: [id number]  CC: Hypertension and Annual Exam   HPI Kailen Delane Wessinger. presents for CPX.  He is concerned that his blood pressure may not be well controlled. He ran out his blood pressure medication a couple weeks ago. He denies any recent episodes of headache/blurred vision/chest pain/shortness of breath/edema/fatigue.   Outpatient Medications Prior to Visit  Medication Sig Dispense Refill  . cetirizine (ZYRTEC) 10 MG tablet Take 1 tablet (10 mg total) by mouth daily. (Patient taking differently: Take 10 mg by mouth daily as needed for allergies. ) 30 tablet 4  . amLODipine-olmesartan (AZOR) 10-40 MG tablet Take 1 tablet by mouth daily. 30 tablet 0  . cyclobenzaprine (FLEXERIL) 10 MG tablet Take 1 tablet (10 mg total) by mouth 3 (three) times daily as needed for muscle spasms. 30 tablet 0  . hydrochlorothiazide (MICROZIDE) 12.5 MG capsule Take 1 capsule (12.5 mg total) by mouth daily. 90 capsule 1  . HYDROcodone-acetaminophen (NORCO/VICODIN) 5-325 MG tablet Take 1 tablet by mouth every 6 (six) hours as needed for severe pain. 10 tablet 0  . mometasone-formoterol (DULERA) 200-5 MCG/ACT AERO Inhale 2 puffs into the lungs 2 (two) times daily. 13 g 11  . naproxen (NAPROSYN) 500 MG tablet Take 1 tablet (500 mg total) by mouth 2 (two) times daily with a meal. As needed for pain (Patient taking differently: Take 500 mg by mouth daily as needed for mild pain. ) 60 tablet 2   No facility-administered medications prior to visit.     ROS Review of Systems  Constitutional: Negative.  Negative for appetite change, diaphoresis, fatigue and unexpected weight change.  HENT: Negative.   Eyes: Negative for visual disturbance.  Respiratory: Positive for apnea. Negative for cough, chest tightness, shortness of breath, wheezing and stridor.   Cardiovascular: Negative for chest pain, palpitations and leg swelling.    Gastrointestinal: Negative for abdominal pain, blood in stool, constipation, diarrhea, nausea and vomiting.  Endocrine: Negative.  Negative for cold intolerance, heat intolerance, polydipsia, polyphagia and polyuria.  Genitourinary: Negative.  Negative for difficulty urinating, discharge, dysuria, frequency, penile pain, penile swelling, scrotal swelling, testicular pain and urgency.  Musculoskeletal: Negative.  Negative for back pain, myalgias and neck pain.  Skin: Negative.  Negative for color change and rash.  Allergic/Immunologic: Negative.   Neurological: Negative.  Negative for dizziness, weakness, numbness and headaches.  Hematological: Negative.  Negative for adenopathy. Does not bruise/bleed easily.  Psychiatric/Behavioral: Positive for sleep disturbance. Negative for decreased concentration and dysphoric mood. The patient is not nervous/anxious.        He complains of difficulty falling asleep and frequent awakenings    Objective:  BP (!) 144/90 (BP Location: Left Arm, Patient Position: Sitting, Cuff Size: Large)   Pulse (!) 109   Temp 97.9 F (36.6 C) (Oral)   Resp 16   Ht 6' (1.829 m)   Wt 233 lb 12 oz (106 kg)   SpO2 95%   BMI 31.70 kg/m   BP Readings from Last 3 Encounters:  03/04/16 (!) 144/90  11/11/15 120/75  06/28/15 110/70    Wt Readings from Last 3 Encounters:  03/04/16 233 lb 12 oz (106 kg)  11/11/15 235 lb (106.6 kg)  06/28/15 229 lb 8 oz (104.1 kg)    Physical Exam  Constitutional: He is oriented to person, place, and time. He appears well-developed and well-nourished. No distress.  HENT:  Head: Normocephalic  and atraumatic.  Mouth/Throat: Oropharynx is clear and moist. No oropharyngeal exudate.  Eyes: Conjunctivae are normal. Right eye exhibits no discharge. Left eye exhibits no discharge. No scleral icterus.  Neck: Normal range of motion. Neck supple. No JVD present. No tracheal deviation present. No thyromegaly present.  Cardiovascular: Normal  rate, regular rhythm, normal heart sounds and intact distal pulses.  Exam reveals no gallop and no friction rub.   No murmur heard. EKG ----  Sinus  Rhythm  -Anterolateral ST-elevation -repolarization variant.   PROBABLY NORMAL  Pulmonary/Chest: Effort normal and breath sounds normal. No stridor. No respiratory distress. He has no wheezes. He has no rales. He exhibits no tenderness.  Abdominal: Soft. Bowel sounds are normal. He exhibits no distension and no mass. There is no tenderness. There is no rebound and no guarding. Hernia confirmed negative in the right inguinal area and confirmed negative in the left inguinal area.  Genitourinary: Prostate normal, testes normal and penis normal. Rectal exam shows guaiac positive stool. Rectal exam shows no external hemorrhoid, no internal hemorrhoid, no fissure, no mass, no tenderness and anal tone normal. Prostate is not enlarged and not tender. Right testis shows no mass, no swelling and no tenderness. Right testis is descended. Left testis shows no mass, no swelling and no tenderness. Left testis is descended. Circumcised. No penile erythema or penile tenderness. No discharge found.  Musculoskeletal: Normal range of motion. He exhibits no edema, tenderness or deformity.  Lymphadenopathy:    He has no cervical adenopathy.       Right: No inguinal adenopathy present.       Left: No inguinal adenopathy present.  Neurological: He is oriented to person, place, and time.  Skin: Skin is warm and dry. No rash noted. He is not diaphoretic. No erythema. No pallor.  Vitals reviewed.   Lab Results  Component Value Date   WBC 5.1 03/04/2016   HGB 13.4 03/04/2016   HCT 39.6 03/04/2016   PLT 261.0 03/04/2016   GLUCOSE 109 (H) 03/04/2016   CHOL 141 03/04/2016   TRIG 66.0 03/04/2016   HDL 45.40 03/04/2016   LDLCALC 82 03/04/2016   ALT 22 03/04/2016   AST 17 03/04/2016   NA 139 03/04/2016   K 4.5 03/04/2016   CL 104 03/04/2016   CREATININE 1.08  03/04/2016   BUN 21 03/04/2016   CO2 28 03/04/2016   TSH 1.14 03/04/2016   PSA 0.89 03/04/2016   HGBA1C 4.7 03/04/2016    Dg Chest 2 View  Result Date: 11/11/2015 CLINICAL DATA:  Pt c/o left sided chest pains x 45 min. No other chest complaints. Hx of HTN. No previous sx. Non smoker. EXAM: CHEST  2 VIEW COMPARISON:  02/13/2015 FINDINGS: The heart size and mediastinal contours are within normal limits. Both lungs are clear. No pleural effusion or pneumothorax. The visualized skeletal structures are unremarkable. IMPRESSION: No active cardiopulmonary disease. Electronically Signed   By: Lajean Manes M.D.   On: 11/11/2015 13:11   Ct Angio Chest/abd/pel For Dissection W And/or W/wo  Result Date: 11/11/2015 CLINICAL DATA:  Left-sided chest pain extending to back this morning with no shortness-of-breath. EXAM: CT ANGIOGRAPHY CHEST, ABDOMEN AND PELVIS TECHNIQUE: Multidetector CT imaging through the chest, abdomen and pelvis was performed using the standard protocol during bolus administration of intravenous contrast. Multiplanar reconstructed images including MIPs were obtained and reviewed to evaluate the vascular anatomy. Pre and post-contrast images were obtained. CONTRAST:  100 mL Isovue 370 IV COMPARISON:  Chest x-ray 11/11/2015 FINDINGS:  CTA CHEST FINDINGS Mild ectasia of the ascending thoracic aorta measuring 3.5 cm in AP diameter. No evidence of aneurysm or dissection involving the thoracic aorta. Normal 3 vessel takeoff from the aortic arch. Pulmonary arterial system is within normal. Mild cardiomegaly. Remaining mediastinal structures are within normal. Lungs are well inflated without consolidation or effusion. Minimal linear atelectasis/scarring over the lingula. Airways are within normal. Review of the MIP images confirms the above findings. CTA ABDOMEN AND PELVIS FINDINGS Abdominal aorta is normal caliber without dissection or aneurysm. The celiac axis, superior and inferior mesenteric arteries  are patent. Single renal arteries are widely patent. Iliac and visualized femoral arteries are normal. The liver, spleen, pancreas, gallbladder and adrenal glands are within normal. Kidneys are normal in size without hydronephrosis or nephrolithiasis. There are a few bilateral renal cysts. Stomach, small bowel, appendix and colon are within normal. Mesentery is within normal. No adenopathy. Small umbilical hernia containing only peritoneal fat. Pelvic images demonstrate a normal bladder, prostate and rectum. Minimal degenerate change of the spine. Review of the MIP images confirms the above findings. IMPRESSION: CTA CHEST IMPRESSION No evidence of thoracic aortic aneurysm or dissection. No acute findings in the chest. Mild ectasia of the ascending thoracic aorta measuring 3.5 cm. Recommend annual imaging followup by CTA or MRA. This recommendation follows 2010 ACCF/AHA/AATS/ACR/ASA/SCA/SCAI/SIR/STS/SVM Guidelines for the Diagnosis and Management of Patients with Thoracic Aortic Disease. Circulation.2010; 121: G811-X726. CTA ABDOMEN AND PELVIS IMPRESSION No evidence of aneurysm or dissection involving the abdominal aorta. No acute findings in the abdomen/pelvis. Bilateral renal cysts. Small umbilical hernia containing only peritoneal fat. Electronically Signed   By: Marin Olp M.D.   On: 11/11/2015 15:53    Assessment & Plan:   Dawn was seen today for hypertension and annual exam.  Diagnoses and all orders for this visit:  Routine general medical examination at a health care facility- exam completed, labs ordered and reviewed, he refused a flu vaccine today, he has a history of colon polyps so was referred for follow-up colonoscopy, patient education material was given. -     Lipid panel; Future -     Comprehensive metabolic panel; Future -     CBC with Differential/Platelet; Future -     TSH; Future -     Urinalysis, Routine w reflex microscopic; Future -     PSA; Future -     Hepatitis C  antibody; Future  ABNORMAL ELECTROCARDIOGRAM- his EKG shows benign repolarization  Essential hypertension, benign- his blood pressure is not adequately well controlled, his EKG is negative for LVH, will restart the cc be an ARB. -     amLODipine-olmesartan (AZOR) 10-40 MG tablet; Take 1 tablet by mouth daily. -     EKG 12-Lead  Hyperglycemia- improvement noted -     Hemoglobin A1c; Future  Obstructive sleep apnea syndrome -     Ambulatory referral to Pulmonology  Insomnia with sleep apnea -     eszopiclone (LUNESTA) 2 MG TABS tablet; Take 1 tablet (2 mg total) by mouth at bedtime as needed for sleep. Take immediately before bedtime  Blood in stool -     Cancel: Ambulatory referral to Gastroenterology -     Ambulatory referral to Gastroenterology  History of colonic polyps- he had polyps on his colonoscopy from 3 years ago, I will ask his gastroenterologist if he is due for a follow-up colonoscopy in light of the blood in his stool. -     Cancel: Ambulatory referral to Gastroenterology -  Ambulatory referral to Gastroenterology   I have discontinued Mr. Prestwood hydrochlorothiazide, mometasone-formoterol, naproxen, HYDROcodone-acetaminophen, cyclobenzaprine, and amLODipine-olmesartan. I am also having him start on amLODipine-olmesartan and eszopiclone. Additionally, I am having him maintain his cetirizine.  Meds ordered this encounter  Medications  . amLODipine-olmesartan (AZOR) 10-40 MG tablet    Sig: Take 1 tablet by mouth daily.    Dispense:  90 tablet    Refill:  1  . eszopiclone (LUNESTA) 2 MG TABS tablet    Sig: Take 1 tablet (2 mg total) by mouth at bedtime as needed for sleep. Take immediately before bedtime    Dispense:  90 tablet    Refill:  1     Follow-up: Return in about 4 months (around 07/02/2016).  Scarlette Calico, MD

## 2016-03-05 ENCOUNTER — Encounter: Payer: Self-pay | Admitting: Internal Medicine

## 2016-04-19 ENCOUNTER — Encounter: Payer: Self-pay | Admitting: Pulmonary Disease

## 2016-04-19 ENCOUNTER — Ambulatory Visit (INDEPENDENT_AMBULATORY_CARE_PROVIDER_SITE_OTHER): Payer: BC Managed Care – PPO | Admitting: Pulmonary Disease

## 2016-04-19 VITALS — BP 144/96 | HR 64 | Ht 72.0 in | Wt 233.4 lb

## 2016-04-19 DIAGNOSIS — G4730 Sleep apnea, unspecified: Secondary | ICD-10-CM

## 2016-04-19 DIAGNOSIS — G4726 Circadian rhythm sleep disorder, shift work type: Secondary | ICD-10-CM | POA: Diagnosis not present

## 2016-04-19 DIAGNOSIS — G4721 Circadian rhythm sleep disorder, delayed sleep phase type: Secondary | ICD-10-CM

## 2016-04-19 DIAGNOSIS — R0683 Snoring: Secondary | ICD-10-CM | POA: Diagnosis not present

## 2016-04-19 DIAGNOSIS — G4700 Insomnia, unspecified: Secondary | ICD-10-CM | POA: Diagnosis not present

## 2016-04-19 MED ORDER — ESZOPICLONE 2 MG PO TABS: 4.0000 mg | ORAL_TABLET | Freq: Every day | ORAL | 1 refills | Status: DC

## 2016-04-19 NOTE — Patient Instructions (Signed)
Lunesta 4 mg nightly  Will arrange for in lab sleep study  Will call to arrange for follow up after sleep study reviewed

## 2016-04-19 NOTE — Progress Notes (Signed)
Past Surgical History He  has a past surgical history that includes dental extraction.  No Known Allergies  Family History His family history includes Alcohol abuse in his father; Hypertension in his brother, mother, and sister; Sickle cell anemia in his mother.  Social History He  reports that he has never smoked. He has never used smokeless tobacco. He reports that he drinks about 3.0 oz of alcohol per week . He reports that he does not use drugs.  Review of systems Constitutional: Positive for unexpected weight change. Negative for fever.  HENT: Positive for dental problem. Negative for congestion, ear pain, nosebleeds, postnasal drip, rhinorrhea, sinus pressure, sneezing and trouble swallowing.   Eyes: Negative for redness and itching.  Respiratory: Negative for cough, chest tightness, shortness of breath and wheezing.   Cardiovascular: Positive for chest pain. Negative for palpitations and leg swelling.  Gastrointestinal: Negative for nausea and vomiting.  Genitourinary: Negative for dysuria.  Musculoskeletal: Negative for joint swelling.  Skin: Negative for rash.  Neurological: Positive for headaches.  Hematological: Does not bruise/bleed easily.  Psychiatric/Behavioral: Negative for dysphoric mood. The patient is not nervous/anxious.     Current Outpatient Prescriptions on File Prior to Visit  Medication Sig  . amLODipine-olmesartan (AZOR) 10-40 MG tablet Take 1 tablet by mouth daily.  . eszopiclone (LUNESTA) 2 MG TABS tablet Take 1 tablet (2 mg total) by mouth at bedtime as needed for sleep. Take immediately before bedtime (Patient not taking: Reported on 04/19/2016)   No current facility-administered medications on file prior to visit.     Chief Complaint  Patient presents with  . SLEEP CONSULT    Referred by Dr Scarlette Calico. Discuss Lunesta. Epworth Score: 7    Past medical history He  has a past medical history of Chronic insomnia; Hypertension; and OSA (obstructive  sleep apnea).  Vital signs BP (!) 144/96 (BP Location: Left Arm, Cuff Size: Normal)   Pulse 64   Ht 6' (1.829 m)   Wt 233 lb 6.4 oz (105.9 kg)   SpO2 97%   BMI 31.65 kg/m   History of Present Illness Brad Hayes. is a 56 y.o. male for evaluation of sleep problems.  He used to work 3 rd shift.  He recently changed to daytime shift.  He has trouble falling asleep during the work week.  He also snores, and his wife has told him he stops breathing while asleep.  He is on several different medications for his blood pressure.  He was started on lunesta by his PCP.  He was advised he needed further sleep assessment.  He takes lunesta about an hour before going to bed.  He falls asleep at 1 am with lunesta.  Otherwise he can stay awake until 3 or 4 in the morning.  He gets out of bed at 6 am on weekdays to go to work.  On weekends he doesn't use lunesta, and will stay awake until 6 am.  He then falls asleep without difficulty.  He has noticed have more trouble feeling tired in the afternoon after working.  He denies sleep walking, sleep talking, bruxism, or nightmares.  There is no history of restless legs.  He denies sleep hallucinations, sleep paralysis, or cataplexy.  The Epworth score is 7 out of 24.   Physical Exam:  General - No distress ENT - No sinus tenderness, no oral exudate, no LAN, no thyromegaly, TM clear, pupils equal/reactive Cardiac - s1s2 regular, no murmur, pulses symmetric Chest - No wheeze/rales/dullness, good  air entry, normal respiratory excursion Back - No focal tenderness Abd - Soft, non-tender, no organomegaly, + bowel sounds Ext - No edema Neuro - Normal strength, cranial nerves intact Skin - No rashes Psych - Normal mood, and behavior  Discussion: He has prior history of shift work.  He has trouble falling asleep at desired time during the work week, but not as much difficulty on weekends when he can stay awake until later.  He has snoring, sleep  disruption, apnea, and daytime sleepiness.  He could also have obstructive sleep apnea.  We discussed how sleep apnea can affect various health problems, including risks for hypertension, cardiovascular disease, and diabetes.  We also discussed how sleep disruption can increase risks for accidents, such as while driving.  Weight loss as a means of improving sleep apnea was also reviewed.  Additional treatment options discussed were CPAP therapy, oral appliance, and surgical intervention.  Assessment/plan:  Snoring with concern for obstructive sleep apnea. 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 - discussed the importance of maintaining regular sleep-wake schedule - reviewed proper sleep hygiene - will assess whether he would benefit from trial of melatonin and/or light therapy after review of his sleep study  Insomnia. - could actually be related to sleep apnea and delayed sleep phase - continue lunesta for now, and reassess need for sleep aide after sleep study reviewed   Patient Instructions  Lunesta 4 mg nightly  Will arrange for in lab sleep study  Will call to arrange for follow up after sleep study reviewed    Chesley Mires, M.D. Pager [phone] 04/19/2016, 3:52 PM

## 2016-04-19 NOTE — Progress Notes (Signed)
   Subjective:    Patient ID: Brad Savannah., male    DOB: 1960/09/17, 56 y.o.   MRN: [id number]  HPI    Review of Systems  Constitutional: Positive for unexpected weight change. Negative for fever.  HENT: Positive for dental problem. Negative for congestion, ear pain, nosebleeds, postnasal drip, rhinorrhea, sinus pressure, sneezing and trouble swallowing.   Eyes: Negative for redness and itching.  Respiratory: Negative for cough, chest tightness, shortness of breath and wheezing.   Cardiovascular: Positive for chest pain. Negative for palpitations and leg swelling.  Gastrointestinal: Negative for nausea and vomiting.  Genitourinary: Negative for dysuria.  Musculoskeletal: Negative for joint swelling.  Skin: Negative for rash.  Neurological: Positive for headaches.  Hematological: Does not bruise/bleed easily.  Psychiatric/Behavioral: Negative for dysphoric mood. The patient is not nervous/anxious.        Objective:   Physical Exam        Assessment & Plan:

## 2016-06-10 ENCOUNTER — Encounter (HOSPITAL_BASED_OUTPATIENT_CLINIC_OR_DEPARTMENT_OTHER): Payer: BC Managed Care – PPO

## 2016-07-09 ENCOUNTER — Other Ambulatory Visit: Payer: Self-pay | Admitting: Internal Medicine

## 2016-07-09 DIAGNOSIS — I10 Essential (primary) hypertension: Secondary | ICD-10-CM

## 2016-07-16 ENCOUNTER — Encounter: Payer: Self-pay | Admitting: Internal Medicine

## 2016-07-16 ENCOUNTER — Ambulatory Visit (INDEPENDENT_AMBULATORY_CARE_PROVIDER_SITE_OTHER): Payer: BC Managed Care – PPO | Admitting: Internal Medicine

## 2016-07-16 VITALS — BP 122/80 | HR 83 | Temp 98.5°F | Resp 16 | Ht 72.0 in | Wt 236.0 lb

## 2016-07-16 DIAGNOSIS — I10 Essential (primary) hypertension: Secondary | ICD-10-CM | POA: Diagnosis not present

## 2016-07-16 DIAGNOSIS — G4730 Sleep apnea, unspecified: Secondary | ICD-10-CM

## 2016-07-16 DIAGNOSIS — G4700 Insomnia, unspecified: Secondary | ICD-10-CM | POA: Diagnosis not present

## 2016-07-16 MED ORDER — ESZOPICLONE 2 MG PO TABS: 2.0000 mg | ORAL_TABLET | Freq: Every evening | ORAL | 3 refills | Status: DC | PRN

## 2016-07-16 NOTE — Progress Notes (Signed)
Subjective:  Patient ID: Brad Hayes., male    DOB: 1960/11/19  Age: 56 y.o. MRN: [id number]  CC: Hypertension   HPI Brad Hayes. presents for a BP check - his BP has been well controlled over the last week. Prior to a few weeks ago he was not compliant with the CCB/ARB combination and developed some headache/blurred vision and elevated blood pressure. Since restarting the combination he has felt well. He does complain of persistent insomnia and wants a refill on Lunesta.  Outpatient Medications Prior to Visit  Medication Sig Dispense Refill  . amLODipine-olmesartan (AZOR) 10-40 MG tablet Take 1 tablet by mouth daily. 90 tablet 1  . eszopiclone (LUNESTA) 2 MG TABS tablet Take 2 tablets (4 mg total) by mouth at bedtime. Take immediately before bedtime 60 tablet 1   No facility-administered medications prior to visit.     ROS Review of Systems  Constitutional: Negative for diaphoresis and fatigue.  HENT: Negative.   Eyes: Negative.  Negative for visual disturbance.  Respiratory: Positive for apnea. Negative for cough, chest tightness, shortness of breath and wheezing.   Cardiovascular: Negative for chest pain, palpitations and leg swelling.  Gastrointestinal: Negative for abdominal pain, constipation, diarrhea, nausea and vomiting.  Endocrine: Negative.   Genitourinary: Negative.  Negative for difficulty urinating.  Musculoskeletal: Negative.  Negative for back pain and neck pain.  Skin: Negative.   Allergic/Immunologic: Negative.   Neurological: Negative.  Negative for dizziness, weakness and light-headedness.  Hematological: Negative.  Negative for adenopathy. Does not bruise/bleed easily.  Psychiatric/Behavioral: Positive for sleep disturbance. Negative for dysphoric mood and suicidal ideas. The patient is not nervous/anxious.     Objective:  BP 122/80 (BP Location: Left Arm, Patient Position: Sitting, Cuff Size: Large)   Pulse 83   Temp 98.5 F (36.9 C) (Oral)    Resp 16   Ht 6' (1.829 m)   Wt 236 lb (107 kg)   SpO2 94%   BMI 32.01 kg/m   BP Readings from Last 3 Encounters:  07/16/16 122/80  04/19/16 (!) 144/96  03/04/16 (!) 144/90    Wt Readings from Last 3 Encounters:  07/16/16 236 lb (107 kg)  04/19/16 233 lb 6.4 oz (105.9 kg)  03/04/16 233 lb 12 oz (106 kg)    Physical Exam  Constitutional: He is oriented to person, place, and time. No distress.  HENT:  Mouth/Throat: Oropharynx is clear and moist. No oropharyngeal exudate.  Eyes: Conjunctivae are normal. Right eye exhibits no discharge. Left eye exhibits no discharge. No scleral icterus.  Neck: Normal range of motion. Neck supple. No JVD present. No tracheal deviation present. No thyromegaly present.  Cardiovascular: Normal rate, regular rhythm, normal heart sounds and intact distal pulses.  Exam reveals no gallop and no friction rub.   No murmur heard. Pulmonary/Chest: Effort normal and breath sounds normal. No stridor. No respiratory distress. He has no wheezes. He has no rales. He exhibits no tenderness.  Abdominal: Soft. Bowel sounds are normal. He exhibits no distension and no mass. There is no tenderness. There is no rebound and no guarding.  Musculoskeletal: Normal range of motion. He exhibits no edema, tenderness or deformity.  Lymphadenopathy:    He has no cervical adenopathy.  Neurological: He is oriented to person, place, and time.  Skin: Skin is warm and dry. No rash noted. He is not diaphoretic. No erythema. No pallor.  Psychiatric: He has a normal mood and affect. His behavior is normal. Judgment and thought content normal.  Vitals reviewed.   Lab Results  Component Value Date   WBC 5.1 03/04/2016   HGB 13.4 03/04/2016   HCT 39.6 03/04/2016   PLT 261.0 03/04/2016   GLUCOSE 109 (H) 03/04/2016   CHOL 141 03/04/2016   TRIG 66.0 03/04/2016   HDL 45.40 03/04/2016   LDLCALC 82 03/04/2016   ALT 22 03/04/2016   AST 17 03/04/2016   NA 139 03/04/2016   K 4.5  03/04/2016   CL 104 03/04/2016   CREATININE 1.08 03/04/2016   BUN 21 03/04/2016   CO2 28 03/04/2016   TSH 1.14 03/04/2016   PSA 0.89 03/04/2016   HGBA1C 4.7 03/04/2016    Dg Chest 2 View  Result Date: 11/11/2015 CLINICAL DATA:  Pt c/o left sided chest pains x 45 min. No other chest complaints. Hx of HTN. No previous sx. Non smoker. EXAM: CHEST  2 VIEW COMPARISON:  02/13/2015 FINDINGS: The heart size and mediastinal contours are within normal limits. Both lungs are clear. No pleural effusion or pneumothorax. The visualized skeletal structures are unremarkable. IMPRESSION: No active cardiopulmonary disease. Electronically Signed   By: Lajean Manes M.D.   On: 11/11/2015 13:11   Ct Angio Chest/abd/pel For Dissection W And/or W/wo  Result Date: 11/11/2015 CLINICAL DATA:  Left-sided chest pain extending to back this morning with no shortness-of-breath. EXAM: CT ANGIOGRAPHY CHEST, ABDOMEN AND PELVIS TECHNIQUE: Multidetector CT imaging through the chest, abdomen and pelvis was performed using the standard protocol during bolus administration of intravenous contrast. Multiplanar reconstructed images including MIPs were obtained and reviewed to evaluate the vascular anatomy. Pre and post-contrast images were obtained. CONTRAST:  100 mL Isovue 370 IV COMPARISON:  Chest x-ray 11/11/2015 FINDINGS: CTA CHEST FINDINGS Mild ectasia of the ascending thoracic aorta measuring 3.5 cm in AP diameter. No evidence of aneurysm or dissection involving the thoracic aorta. Normal 3 vessel takeoff from the aortic arch. Pulmonary arterial system is within normal. Mild cardiomegaly. Remaining mediastinal structures are within normal. Lungs are well inflated without consolidation or effusion. Minimal linear atelectasis/scarring over the lingula. Airways are within normal. Review of the MIP images confirms the above findings. CTA ABDOMEN AND PELVIS FINDINGS Abdominal aorta is normal caliber without dissection or aneurysm. The  celiac axis, superior and inferior mesenteric arteries are patent. Single renal arteries are widely patent. Iliac and visualized femoral arteries are normal. The liver, spleen, pancreas, gallbladder and adrenal glands are within normal. Kidneys are normal in size without hydronephrosis or nephrolithiasis. There are a few bilateral renal cysts. Stomach, small bowel, appendix and colon are within normal. Mesentery is within normal. No adenopathy. Small umbilical hernia containing only peritoneal fat. Pelvic images demonstrate a normal bladder, prostate and rectum. Minimal degenerate change of the spine. Review of the MIP images confirms the above findings. IMPRESSION: CTA CHEST IMPRESSION No evidence of thoracic aortic aneurysm or dissection. No acute findings in the chest. Mild ectasia of the ascending thoracic aorta measuring 3.5 cm. Recommend annual imaging followup by CTA or MRA. This recommendation follows 2010 ACCF/AHA/AATS/ACR/ASA/SCA/SCAI/SIR/STS/SVM Guidelines for the Diagnosis and Management of Patients with Thoracic Aortic Disease. Circulation.2010; 121: M578-I696. CTA ABDOMEN AND PELVIS IMPRESSION No evidence of aneurysm or dissection involving the abdominal aorta. No acute findings in the abdomen/pelvis. Bilateral renal cysts. Small umbilical hernia containing only peritoneal fat. Electronically Signed   By: Marin Olp M.D.   On: 11/11/2015 15:53    Assessment & Plan:   Hudson was seen today for hypertension.  Diagnoses and all orders for this visit:  Essential hypertension, benign - his BP is well controlled, will cont the CCB/ARB combination  Insomnia with sleep apnea -     eszopiclone (LUNESTA) 2 MG TABS tablet; Take 1 tablet (2 mg total) by mouth at bedtime as needed for sleep. Take immediately before bedtime   I have changed Mr. Schellhorn eszopiclone. I am also having him maintain his amLODipine-olmesartan.  Meds ordered this encounter  Medications  . eszopiclone (LUNESTA) 2 MG  TABS tablet    Sig: Take 1 tablet (2 mg total) by mouth at bedtime as needed for sleep. Take immediately before bedtime    Dispense:  30 tablet    Refill:  3     Follow-up: No Follow-up on file.  Scarlette Calico, MD

## 2016-07-16 NOTE — Patient Instructions (Signed)

## 2016-07-31 ENCOUNTER — Ambulatory Visit (HOSPITAL_BASED_OUTPATIENT_CLINIC_OR_DEPARTMENT_OTHER): Payer: BC Managed Care – PPO | Attending: Pulmonary Disease | Admitting: Pulmonary Disease

## 2016-07-31 VITALS — Ht 72.0 in | Wt 230.0 lb

## 2016-07-31 DIAGNOSIS — R0683 Snoring: Secondary | ICD-10-CM | POA: Diagnosis present

## 2016-07-31 DIAGNOSIS — G4733 Obstructive sleep apnea (adult) (pediatric): Secondary | ICD-10-CM | POA: Diagnosis not present

## 2016-07-31 DIAGNOSIS — G4736 Sleep related hypoventilation in conditions classified elsewhere: Secondary | ICD-10-CM | POA: Diagnosis not present

## 2016-08-05 ENCOUNTER — Telehealth: Payer: Self-pay | Admitting: Pulmonary Disease

## 2016-08-05 DIAGNOSIS — G4733 Obstructive sleep apnea (adult) (pediatric): Secondary | ICD-10-CM | POA: Diagnosis not present

## 2016-08-05 DIAGNOSIS — R0683 Snoring: Secondary | ICD-10-CM | POA: Diagnosis not present

## 2016-08-05 NOTE — Procedures (Signed)
   Patient Name: Brad Hayes, Brad Hayes Date: 07/31/2016 Gender: Male D.O.B: [birth year] Age (years): 41 Referring Provider: Chesley Mires MD, ABSM Height (inches): 72 Interpreting Physician: Chesley Mires MD, ABSM Weight (lbs): 230 RPSGT: Jonna Coup BMI: 31 MRN: [id number] Neck Size: 17.00 CLINICAL INFORMATION Sleep Study Type: NPSG  Indication for sleep study: Obesity, Snoring  Epworth Sleepiness Score: 4  SLEEP STUDY TECHNIQUE As per the AASM Manual for the Scoring of Sleep and Associated Events v2.3 (April 2016) with a hypopnea requiring 4% desaturations.  The channels recorded and monitored were frontal, central and occipital EEG, electrooculogram (EOG), submentalis EMG (chin), nasal and oral airflow, thoracic and abdominal wall motion, anterior tibialis EMG, snore microphone, electrocardiogram, and pulse oximetry.  MEDICATIONS Medications self-administered by patient taken the night of the study : N/A  SLEEP ARCHITECTURE The study was initiated at 10:08:16 PM and ended at 4:15:19 AM.  Sleep onset time was 113.5 minutes and the sleep efficiency was 57.2%. The total sleep time was 210.1 minutes.  Stage REM latency was 173.5 minutes.  The patient spent 3.57% of the night in stage N1 sleep, 89.77% in stage N2 sleep, 2.38% in stage N3 and 4.28% in REM.  Alpha intrusion was absent.  Supine sleep was 28.94%.  RESPIRATORY PARAMETERS The overall apnea/hypopnea index (AHI) was 19.1 per hour. There were 15 total apneas, including 10 obstructive, 5 central and 0 mixed apneas. There were 52 hypopneas and 5 RERAs.  The AHI during Stage REM sleep was 86.7 per hour.  AHI while supine was 34.5 per hour.  The mean oxygen saturation was 91.52%. The minimum SpO2 during sleep was 78.00%.  Loud snoring was noted during this study.  CARDIAC DATA The 2 lead EKG demonstrated sinus rhythm. The mean heart rate was 73.15 beats per minute. Other EKG findings include: None. LEG  MOVEMENT DATA The total PLMS were 0 with a resulting PLMS index of 0.00. Associated arousal with leg movement index was 0.0 .  IMPRESSIONS - Mild obstructive sleep apnea occurred during this study (AHI = 19.1/h). - Moderate oxygen desaturation was noted during this study (Min O2 = 78.00%). DIAGNOSIS - Obstructive Sleep Apnea (327.23 [G47.33 ICD-10]) - Nocturnal Hypoxemia (327.26 [G47.36 ICD-10]) RECOMMENDATIONS - Additional therapies include weight loss, CPAP, oral appliance or surgical assessment.  [Electronically signed] 08/05/2016 10:21 AM  Chesley Mires MD, ABSM Diplomate, American Board of Sleep Medicine   NPI: [id number]

## 2016-08-05 NOTE — Telephone Encounter (Signed)
Called patient; unable to leave voicemail as box has not been set up. Will try again at later time.

## 2016-08-05 NOTE — Telephone Encounter (Signed)
PSG 07/31/16 >> AHI 19.1, SaO2 low 78%.   Will have my nurse inform pt that sleep study shows moderate sleep apnea.  Options are 1) CPAP now, 2) ROV first.  If He is agreeable to CPAP, then please send order for auto CPAP range 5 to 15 cm H2O with heated humidity and mask of choice.  Have download sent 1 month after starting CPAP and set up ROV 2 months after starting CPAP.  ROV can be with me or NP.

## 2016-08-05 NOTE — Progress Notes (Signed)
Patient Name: Brad Hayes, Brad Hayes Date: 07/31/2016 Gender: Male D.O.B: 10/29/1960 Age (years): 81 Referring Provider: Chesley Mires MD, ABSM Height (inches): 72 Interpreting Physician: Chesley Mires MD, ABSM Weight (lbs): 230 RPSGT: Jonna Coup BMI: 31 MRN: [id number] Neck Size: 17.00  CLINICAL INFORMATION Sleep Study Type: NPSG  Indication for sleep study: Obesity, Snoring  Epworth Sleepiness Score: 4  SLEEP STUDY TECHNIQUE As per the AASM Manual for the Scoring of Sleep and Associated Events v2.3 (April 2016) with a hypopnea requiring 4% desaturations.  The channels recorded and monitored were frontal, central and occipital EEG, electrooculogram (EOG), submentalis EMG (chin), nasal and oral airflow, thoracic and abdominal wall motion, anterior tibialis EMG, snore microphone, electrocardiogram, and pulse oximetry.  MEDICATIONS Medications self-administered by patient taken the night of the study : N/A  SLEEP ARCHITECTURE The study was initiated at 10:08:16 PM and ended at 4:15:19 AM.  Sleep onset time was 113.5 minutes and the sleep efficiency was 57.2%. The total sleep time was 210.1 minutes.  Stage REM latency was 173.5 minutes.  The patient spent 3.57% of the night in stage N1 sleep, 89.77% in stage N2 sleep, 2.38% in stage N3 and 4.28% in REM.  Alpha intrusion was absent.  Supine sleep was 28.94%.  RESPIRATORY PARAMETERS The overall apnea/hypopnea index (AHI) was 19.1 per hour. There were 15 total apneas, including 10 obstructive, 5 central and 0 mixed apneas. There were 52 hypopneas and 5 RERAs.  The AHI during Stage REM sleep was 86.7 per hour.  AHI while supine was 34.5 per hour.  The mean oxygen saturation was 91.52%. The minimum SpO2 during sleep was 78.00%.  Loud snoring was noted during this study.  CARDIAC DATA The 2 lead EKG demonstrated sinus rhythm. The mean heart rate was 73.15 beats per minute. Other EKG findings include: None.  LEG MOVEMENT  DATA The total PLMS were 0 with a resulting PLMS index of 0.00. Associated arousal with leg movement index was 0.0 .  IMPRESSIONS - Mild obstructive sleep apnea occurred during this study (AHI = 19.1/h). - Moderate oxygen desaturation was noted during this study (Min O2 = 78.00%).  DIAGNOSIS - Obstructive Sleep Apnea (327.23 [G47.33 ICD-10]) - Nocturnal Hypoxemia (327.26 [G47.36 ICD-10])  RECOMMENDATIONS - Additional therapies include weight loss, CPAP, oral appliance or surgical assessment.  [Electronically signed] 08/05/2016 10:21 AM  Chesley Mires MD, ABSM Diplomate, American Board of Sleep Medicine   NPI: [id number]

## 2016-08-06 NOTE — Telephone Encounter (Signed)
Pt is aware of results and voiced his understanding. Order has been placed for cpap. Pt has been scheduled with TP on 8/27 @ 11:30. Nothing further needed.

## 2016-10-06 ENCOUNTER — Other Ambulatory Visit: Payer: Self-pay | Admitting: Internal Medicine

## 2016-10-06 DIAGNOSIS — I10 Essential (primary) hypertension: Secondary | ICD-10-CM

## 2016-10-18 ENCOUNTER — Telehealth: Payer: Self-pay | Admitting: Pulmonary Disease

## 2016-10-18 NOTE — Telephone Encounter (Signed)
Called [facility] to get a CPAP download for patient. They stated that they reached out to the patient back in June 2018 and the patient refused the CPAP machine. Patient was scheduled with TP for 10/21/16. Spoke with patient, he wishes to cancel this appt. Appt has been cancelled. Nothing else needed at time of call.

## 2016-10-21 ENCOUNTER — Ambulatory Visit: Payer: BC Managed Care – PPO | Admitting: Adult Health

## 2017-01-29 ENCOUNTER — Other Ambulatory Visit: Payer: Self-pay | Admitting: Internal Medicine

## 2017-01-29 DIAGNOSIS — I10 Essential (primary) hypertension: Secondary | ICD-10-CM

## 2017-04-11 ENCOUNTER — Encounter: Payer: Self-pay | Admitting: Family

## 2017-04-11 ENCOUNTER — Ambulatory Visit: Payer: BC Managed Care – PPO | Admitting: Family

## 2017-04-11 VITALS — BP 132/82 | HR 84 | Temp 98.1°F | Ht 73.0 in | Wt 243.0 lb

## 2017-04-11 DIAGNOSIS — J209 Acute bronchitis, unspecified: Secondary | ICD-10-CM

## 2017-04-11 DIAGNOSIS — G4733 Obstructive sleep apnea (adult) (pediatric): Secondary | ICD-10-CM | POA: Diagnosis not present

## 2017-04-11 MED ORDER — AMOXICILLIN-POT CLAVULANATE 875-125 MG PO TABS: 1.0000 | ORAL_TABLET | Freq: Two times a day (BID) | ORAL | 0 refills | Status: DC

## 2017-04-11 MED ORDER — PREDNISONE 20 MG PO TABS: 40.0000 mg | ORAL_TABLET | Freq: Every day | ORAL | 0 refills | Status: DC

## 2017-04-11 NOTE — Progress Notes (Signed)
Brad Hayes. is a 57 y.o. male with the following history as recorded in [facility]:  Patient Active Problem List   Diagnosis Date Noted  . Blood in stool 03/04/2016  . History of colonic polyps 03/04/2016  . Plantar fasciitis, bilateral 03/23/2015  . Asthma with exacerbation 02/01/2015  . Allergic rhinitis 06/23/2014  . Hyperglycemia 03/10/2012  . Routine general medical examination at a health care facility 03/10/2012  . Low back pain radiating to both legs 09/20/2011  . Insomnia with sleep apnea 03/21/2010  . Sleep apnea 03/21/2010  . ABNORMAL ELECTROCARDIOGRAM 03/21/2010  . ERECTILE DYSFUNCTION 03/22/2009  . Essential hypertension, benign 03/22/2009    Current Outpatient Medications  Medication Sig Dispense Refill  . amLODipine-olmesartan (AZOR) 10-40 MG tablet TAKE 1 TABLET BY MOUTH EVERY DAY 90 tablet 0  . eszopiclone (LUNESTA) 2 MG TABS tablet Take 1 tablet (2 mg total) by mouth at bedtime as needed for sleep. Take immediately before bedtime 30 tablet 3  . amoxicillin-clavulanate (AUGMENTIN) 875-125 MG tablet Take 1 tablet by mouth 2 (two) times daily. 20 tablet 0  . predniSONE (DELTASONE) 20 MG tablet Take 2 tablets (40 mg total) by mouth daily with breakfast. 5 tablet 0   No current facility-administered medications for this visit.     Allergies: Patient has no known allergies.  Past Medical History:  Diagnosis Date  . Chronic insomnia   . Hypertension   . OSA (obstructive sleep apnea)     Past Surgical History:  Procedure Laterality Date  . dental extraction      Family History  Problem Relation Age of Onset  . Hypertension Mother   . Sickle cell anemia Mother   . Alcohol abuse Father   . Hypertension Sister   . Hypertension Brother   . Stroke Neg Hx   . Kidney disease Neg Hx   . Heart disease Neg Hx   . Hyperlipidemia Neg Hx   . Early death Neg Hx   . Diabetes Neg Hx   . COPD Neg Hx   . Cancer Neg Hx     Social History   Tobacco Use  . Smoking  status: Never Smoker  . Smokeless tobacco: Never Used  Substance Use Topics  . Alcohol use: Yes    Alcohol/week: 3.0 oz    Types: 5 Cans of beer per week    Comment: occassional    Subjective:  Patient presents with concerns for cough/ congestion x 1- 1 1/2 weeks; using OTC Mucinex and Nyquil with no benefit; history of sickness induced asthma; denies any chest pain or shortness of breath; "Just doesn't feel like he can shake the infection." + night sweats;  Also mentions continued problems with chronic fatigue/ insomnia; has been treated for insomnia in the past; also diagnosed with OSA and CPAP was recommended; appears to be some confusion on patient's part about this machine; he thought he was told he was not a candidate for CPAP;    Objective:  Vitals:   04/11/17 0918  BP: 132/82  Pulse: 84  Temp: 98.1 F (36.7 C)  TempSrc: Oral  SpO2: 98%  Weight: 243 lb 0.6 oz (110.2 kg)  Height: 6' 1"  (1.854 m)    General: Well developed, well nourished, in no acute distress  Skin : Warm and dry.  Head: Normocephalic and atraumatic  Eyes: Sclera and conjunctiva clear; pupils round and reactive to light; extraocular movements intact  Ears: External normal; canals clear; tympanic membranes normal  Oropharynx: Pink, supple. No suspicious  lesions  Neck: Supple without thyromegaly, adenopathy  Lungs: Respirations unlabored; wheezing in all 4 lobes CVS exam: normal rate and regular rhythm.  Neurologic: Alert and oriented; speech intact; face symmetrical; moves all extremities well; CNII-XII intact without focal deficit   Assessment:  1. Acute bronchitis, unspecified organism   2. Obstructive sleep apnea syndrome     Plan:  1. Rx for Augmentin 875 mg bid x 10 days; Rx for Prednisone 20 mg 2 po qd x 5 days; increase fluids, rest and follow-up worse, no better.  2. Refer back to pulmonology to discuss CPAP machine; information and education provided to patient about what benefit CPAP will  offer him; he agrees to follow-up.   Return in about 2 months (around 06/09/2017) for CPE with Dr. Ronnald Ramp.  Orders Placed This Encounter  Procedures  . Ambulatory referral to Pulmonology    Referral Priority:   Routine    Referral Type:   Consultation    Referral Reason:   Specialty Services Required    Referred to Provider:   Chesley Mires, MD    Requested Specialty:   Pulmonary Disease    Number of Visits Requested:   1    Requested Prescriptions   Signed Prescriptions Disp Refills  . amoxicillin-clavulanate (AUGMENTIN) 875-125 MG tablet 20 tablet 0    Sig: Take 1 tablet by mouth 2 (two) times daily.  . predniSONE (DELTASONE) 20 MG tablet 5 tablet 0    Sig: Take 2 tablets (40 mg total) by mouth daily with breakfast.

## 2017-04-11 NOTE — Patient Instructions (Signed)

## 2017-04-16 ENCOUNTER — Other Ambulatory Visit: Payer: Self-pay | Admitting: Family

## 2017-04-16 MED ORDER — PREDNISONE 20 MG PO TABS: 40.0000 mg | ORAL_TABLET | Freq: Every day | ORAL | 0 refills | Status: DC

## 2017-04-28 ENCOUNTER — Ambulatory Visit: Payer: BC Managed Care – PPO | Admitting: Adult Health

## 2017-05-05 ENCOUNTER — Ambulatory Visit: Payer: BC Managed Care – PPO | Admitting: Adult Health

## 2017-05-13 ENCOUNTER — Ambulatory Visit: Payer: BC Managed Care – PPO | Admitting: Adult Health

## 2017-05-23 ENCOUNTER — Other Ambulatory Visit: Payer: Self-pay | Admitting: Internal Medicine

## 2017-05-23 DIAGNOSIS — I10 Essential (primary) hypertension: Secondary | ICD-10-CM

## 2017-06-09 ENCOUNTER — Ambulatory Visit: Payer: BC Managed Care – PPO | Admitting: Internal Medicine

## 2017-07-04 ENCOUNTER — Other Ambulatory Visit (INDEPENDENT_AMBULATORY_CARE_PROVIDER_SITE_OTHER): Payer: BC Managed Care – PPO

## 2017-07-04 ENCOUNTER — Ambulatory Visit: Payer: BC Managed Care – PPO | Admitting: Internal Medicine

## 2017-07-04 ENCOUNTER — Encounter: Payer: Self-pay | Admitting: Internal Medicine

## 2017-07-04 VITALS — BP 130/80 | HR 79 | Temp 98.6°F | Ht 73.0 in | Wt 242.0 lb

## 2017-07-04 DIAGNOSIS — R739 Hyperglycemia, unspecified: Secondary | ICD-10-CM

## 2017-07-04 DIAGNOSIS — S76319A Strain of muscle, fascia and tendon of the posterior muscle group at thigh level, unspecified thigh, initial encounter: Secondary | ICD-10-CM

## 2017-07-04 DIAGNOSIS — R252 Cramp and spasm: Secondary | ICD-10-CM

## 2017-07-04 DIAGNOSIS — G8929 Other chronic pain: Secondary | ICD-10-CM

## 2017-07-04 DIAGNOSIS — M545 Low back pain: Secondary | ICD-10-CM

## 2017-07-04 DIAGNOSIS — Z23 Encounter for immunization: Secondary | ICD-10-CM

## 2017-07-04 LAB — BASIC METABOLIC PANEL
BUN: 18 mg/dL (ref 6–23)
CO2: 29 mEq/L (ref 19–32)
Calcium: 9.3 mg/dL (ref 8.4–10.5)
Chloride: 106 mEq/L (ref 96–112)
Creatinine, Ser: 1.07 mg/dL (ref 0.40–1.50)
GFR: 91.43 mL/min (ref >60.00)
Glucose, Bld: 108 mg/dL — ABNORMAL HIGH (ref 70–99)
POTASSIUM: 4 meq/L (ref 3.5–5.1)
SODIUM: 141 meq/L (ref 135–145)

## 2017-07-04 LAB — HEMOGLOBIN A1C: HEMOGLOBIN A1C: 4.9 % (ref 4.6–6.5)

## 2017-07-04 LAB — MAGNESIUM: MAGNESIUM: 2.4 mg/dL (ref 1.5–2.5)

## 2017-07-04 MED ORDER — TRAMADOL HCL 50 MG PO TABS: 50.0000 mg | ORAL_TABLET | Freq: Three times a day (TID) | ORAL | 0 refills | Status: DC | PRN

## 2017-07-04 MED ORDER — CYCLOBENZAPRINE HCL 5 MG PO TABS
5.0000 mg | ORAL_TABLET | Freq: Three times a day (TID) | ORAL | 2 refills | Status: DC | PRN
Start: 2017-07-04 — End: 2018-04-27

## 2017-07-04 NOTE — Patient Instructions (Signed)
Please take all new medication as prescribed - the pain medication, and muscle relaxer as needed  Please continue all other medications as before, and refills have been done if requested.  Please have the pharmacy call with any other refills you may need.  Please continue your efforts at being more active, low cholesterol diet, and weight control..  Please keep your appointments with your specialists as you may have planned  Please go to the LAB in the Basement (turn left off the elevator) for the tests to be done today  You will be contacted by phone if any changes need to be made immediately.  Otherwise, you will receive a letter about your results with an explanation, but please check with MyChart first.  Please remember to sign up for MyChart if you have not done so, as this will be important to you in the future with finding out test results, communicating by private email, and scheduling acute appointments online when needed.

## 2017-07-04 NOTE — Progress Notes (Signed)
Subjective:    Patient ID: Brad Savannah., male    DOB: [birth year], 57 y.o.   MRN: [id number]  HPI  Here with mild to mod gradually worsening 1-2 mo recurring LBP as well as post upper back of leg pains, without bowel or bladder change, fever, wt loss,  worsening LE pain/numbness/weakness, gait change or falls.  Pain chronic persistent despite 7 alleve per day, wife noticed he takes too much and had him come here.  Also 1.5 mo leg "cramps" to the post upper legs,  that has been so severe at times that he woke up and cried, walking seemed to help and relieve at the time.  This seems to be a different pain from the back pain.  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 No other radicular symptoms. 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  Does not really have to work the second job but enjoys the Theatre stage manager.  Has ongoing umbilical hernia but no pain.   Pt denies polydipsia, polyuria  Past Medical History:  Diagnosis Date  . Chronic insomnia   . Hypertension   . OSA (obstructive sleep apnea)    Past Surgical History:  Procedure Laterality Date  . dental extraction      reports that he has never smoked. He has never used smokeless tobacco. He reports that he drinks about 3.0 oz of alcohol per week. He reports that he does not use drugs. family history includes Alcohol abuse in his father; Hypertension in his brother, mother, and sister; Sickle cell anemia in his mother. No Known Allergies Current Outpatient Medications on File Prior to Visit  Medication Sig Dispense Refill  . amLODipine-olmesartan (AZOR) 10-40 MG tablet TAKE 1 TABLET BY MOUTH EVERY DAY 90 tablet 0  . eszopiclone (LUNESTA) 2 MG TABS tablet Take 1 tablet (2 mg total) by mouth at bedtime as needed for sleep. Take immediately before bedtime 30 tablet 3   No current facility-administered medications on file prior to visit.    Review of Systems  Constitutional: Negative  for other unusual diaphoresis or sweats HENT: Negative for ear discharge or swelling Eyes: Negative for other worsening visual disturbances Respiratory: Negative for stridor or other swelling  Gastrointestinal: Negative for worsening distension or other blood Genitourinary: Negative for retention or other urinary change Musculoskeletal: Negative for other MSK pain or swelling Skin: Negative for color change or other new lesions Neurological: Negative for worsening tremors and other numbness  Psychiatric/Behavioral: Negative for worsening agitation or other fatigue All other system neg per pt    Objective:   Physical Exam BP 130/80 (BP Location: Left Arm, Patient Position: Sitting, Cuff Size: Large)   Pulse 79   Temp 98.6 F (37 C) (Oral)   Ht 6' 1"  (1.854 m)   Wt 242 lb (109.8 kg)   SpO2 93%   BMI 31.93 kg/m  VS noted,  Constitutional: Pt appears in NAD HENT: Head: NCAT.  Right Ear: External ear normal.  Left Ear: External ear normal.  Eyes: . Pupils are equal, round, and reactive to light. Conjunctivae and EOM are normal Nose: without d/c or deformity Neck: Neck supple. Gross normal ROM Cardiovascular: Normal rate and regular rhythm.   Pulmonary/Chest: Effort normal and breath sounds without rales or wheezing.  Abd:  Soft, NT, ND, + BS, no organomegaly Spine nontender, has bilat lower lumbar area tender spasms + tender to bilat hamstrings and + pain to straight leg raises Neurological:  Pt is alert. At baseline orientation, motor grossly intact Skin: Skin is warm. No rashes, other new lesions, no LE edema Psychiatric: Pt behavior is normal without agitation  No other exam findings    Assessment & Plan:

## 2017-07-05 NOTE — Assessment & Plan Note (Signed)
D/w pt, likely due to overuse, for rest, pain control, muscle relaxer prn, may need to consider letting the second job go

## 2017-07-05 NOTE — Assessment & Plan Note (Signed)
C/w msk strain due to overuse, for pain control, muscle relaxer prn,  to f/u any worsening symptoms or concerns

## 2017-07-05 NOTE — Assessment & Plan Note (Signed)
stable overall by history and exam, recent data reviewed with pt, and pt to continue medical treatment as before,  to f/u any worsening symptoms or concerns Lab Results  Component Value Date   HGBA1C 4.9 07/04/2017

## 2017-07-05 NOTE — Assessment & Plan Note (Signed)
Also for labs as ordered, drink more fluids

## 2017-07-11 ENCOUNTER — Other Ambulatory Visit: Payer: Self-pay | Admitting: Internal Medicine

## 2017-07-11 DIAGNOSIS — I10 Essential (primary) hypertension: Secondary | ICD-10-CM

## 2018-02-03 ENCOUNTER — Other Ambulatory Visit: Payer: Self-pay | Admitting: Internal Medicine

## 2018-02-03 DIAGNOSIS — I10 Essential (primary) hypertension: Secondary | ICD-10-CM

## 2018-03-09 ENCOUNTER — Telehealth: Payer: Self-pay | Admitting: Internal Medicine

## 2018-03-09 DIAGNOSIS — I10 Essential (primary) hypertension: Secondary | ICD-10-CM

## 2018-03-09 NOTE — Telephone Encounter (Signed)
Requested Prescriptions  Refused Prescriptions Disp Refills  . amLODipine-olmesartan (AZOR) 10-40 MG tablet 90 tablet 0    Sig: Take 1 tablet by mouth daily. Patient needs appt with pcp before further refills     Cardiovascular: CCB + ARB Combos Failed - 03/09/2018  1:11 PM      Failed - K in normal range and within 180 days    Potassium  Date Value Ref Range Status  07/04/2017 4.0 3.5 - 5.1 mEq/L Final         Failed - Cr in normal range and within 180 days    Creatinine, Ser  Date Value Ref Range Status  07/04/2017 1.07 0.40 - 1.50 mg/dL Final         Failed - Valid encounter within last 6 months    Recent Outpatient Visits          8 months ago Chronic midline low back pain without sciatica   [place], James W, MD   11 months ago Acute bronchitis, unspecified organism   [place], Marvis Repress, [place]   1 year ago Essential hypertension, benign   [place], [place]   2 years ago Routine general medical examination at a health care facility   [place], [place]   2 years ago Asthma due to seasonal allergies   [place], Yvetta Coder, FNP      Future Appointments            In 3 weeks Janith Lima, MD [place], [place] - Patient is not pregnant      Passed - Last BP in normal range    BP Readings from Last 1 Encounters:  07/04/17 130/80

## 2018-03-09 NOTE — Telephone Encounter (Signed)
Copied from [place] [id number]. Topic: Quick Communication - Rx Refill/Question >> Mar 09, 2018 11:26 AM Judyann Munson wrote: Medication: amLODipine-olmesartan (AZOR) 10-40 MG tablet  Has the patient contacted their pharmacy? Yes   Preferred Pharmacy (with phone number or street name):[id number] [phone](Phone) [phone](Fax)    Patient is completely out of medication

## 2018-03-31 MED ORDER — AMLODIPINE-OLMESARTAN 10-40 MG PO TABS: 1.0000 | ORAL_TABLET | Freq: Every day | ORAL | 0 refills | Status: DC

## 2018-03-31 NOTE — Telephone Encounter (Signed)
Patient was originally scheduled to be seen on 04/01/2018 but had to be rescheduled due to Dr Ronnald Ramp being out sick. He is currently on the schedule for the first available opening, 04/27/2018.

## 2018-03-31 NOTE — Telephone Encounter (Signed)
Erx sent for the BP medication.

## 2018-04-01 ENCOUNTER — Ambulatory Visit: Payer: BC Managed Care – PPO | Admitting: Internal Medicine

## 2018-04-27 ENCOUNTER — Ambulatory Visit: Payer: BC Managed Care – PPO | Admitting: Internal Medicine

## 2018-04-27 ENCOUNTER — Encounter: Payer: Self-pay | Admitting: Internal Medicine

## 2018-04-27 ENCOUNTER — Other Ambulatory Visit (INDEPENDENT_AMBULATORY_CARE_PROVIDER_SITE_OTHER): Payer: BC Managed Care – PPO

## 2018-04-27 VITALS — BP 174/106 | HR 86 | Temp 98.5°F | Resp 16 | Ht 73.0 in | Wt 248.0 lb

## 2018-04-27 DIAGNOSIS — I10 Essential (primary) hypertension: Secondary | ICD-10-CM

## 2018-04-27 DIAGNOSIS — R739 Hyperglycemia, unspecified: Secondary | ICD-10-CM

## 2018-04-27 DIAGNOSIS — M545 Low back pain: Secondary | ICD-10-CM

## 2018-04-27 DIAGNOSIS — G4733 Obstructive sleep apnea (adult) (pediatric): Secondary | ICD-10-CM | POA: Diagnosis not present

## 2018-04-27 DIAGNOSIS — K7581 Nonalcoholic steatohepatitis (NASH): Secondary | ICD-10-CM | POA: Insufficient documentation

## 2018-04-27 DIAGNOSIS — Z86010 Personal history of colonic polyps: Secondary | ICD-10-CM

## 2018-04-27 DIAGNOSIS — Z0000 Encounter for general adult medical examination without abnormal findings: Secondary | ICD-10-CM

## 2018-04-27 DIAGNOSIS — R7989 Other specified abnormal findings of blood chemistry: Secondary | ICD-10-CM

## 2018-04-27 DIAGNOSIS — G8929 Other chronic pain: Secondary | ICD-10-CM

## 2018-04-27 DIAGNOSIS — R945 Abnormal results of liver function studies: Secondary | ICD-10-CM

## 2018-04-27 LAB — CBC WITH DIFFERENTIAL/PLATELET
Basophils Absolute: 0 10*3/uL (ref 0.0–0.1)
Basophils Relative: 0.6 % (ref 0.0–3.0)
Eosinophils Absolute: 0.1 10*3/uL (ref 0.0–0.7)
Eosinophils Relative: 1.3 % (ref 0.0–5.0)
HCT: 39.6 % (ref 39.0–52.0)
Hemoglobin: 13.3 g/dL (ref 13.0–17.0)
Lymphocytes Relative: 42.8 % (ref 12.0–46.0)
Lymphs Abs: 2.3 10*3/uL (ref 0.7–4.0)
MCHC: 33.6 g/dL (ref 30.0–36.0)
MCV: 86.7 fl (ref 78.0–100.0)
MONOS PCT: 8.7 % (ref 3.0–12.0)
Monocytes Absolute: 0.5 10*3/uL (ref 0.1–1.0)
Neutro Abs: 2.5 10*3/uL (ref 1.4–7.7)
Neutrophils Relative %: 46.6 % (ref 43.0–77.0)
Platelets: 243 10*3/uL (ref 150.0–400.0)
RBC: 4.57 Mil/uL (ref 4.22–5.81)
RDW: 12.6 % (ref 11.5–15.5)
WBC: 5.3 10*3/uL (ref 4.0–10.5)

## 2018-04-27 LAB — LIPID PANEL
Cholesterol: 124 mg/dL (ref 0–200)
HDL: 38.6 mg/dL — ABNORMAL LOW (ref >39.00)
LDL Cholesterol: 64 mg/dL (ref 0–99)
NONHDL: 84.95
Total CHOL/HDL Ratio: 3
Triglycerides: 104 mg/dL (ref 0.0–149.0)
VLDL: 20.8 mg/dL (ref 0.0–40.0)

## 2018-04-27 LAB — URINALYSIS, ROUTINE W REFLEX MICROSCOPIC
Bilirubin Urine: NEGATIVE
Hgb urine dipstick: NEGATIVE
Ketones, ur: NEGATIVE
Leukocytes,Ua: NEGATIVE
NITRITE: NEGATIVE
Specific Gravity, Urine: >=1.03 — AB (ref 1.000–1.030)
TOTAL PROTEIN, URINE-UPE24: NEGATIVE
Urine Glucose: NEGATIVE
Urobilinogen, UA: 0.2 (ref 0.0–1.0)
pH: 6 (ref 5.0–8.0)

## 2018-04-27 LAB — COMPREHENSIVE METABOLIC PANEL
ALT: 98 U/L — ABNORMAL HIGH (ref 0–53)
AST: 133 U/L — ABNORMAL HIGH (ref 0–37)
Albumin: 4.6 g/dL (ref 3.5–5.2)
Alkaline Phosphatase: 57 U/L (ref 39–117)
BUN: 11 mg/dL (ref 6–23)
CO2: 26 mEq/L (ref 19–32)
Calcium: 9.3 mg/dL (ref 8.4–10.5)
Chloride: 103 mEq/L (ref 96–112)
Creatinine, Ser: 1.06 mg/dL (ref 0.40–1.50)
GFR: 86.71 mL/min (ref >60.00)
Glucose, Bld: 109 mg/dL — ABNORMAL HIGH (ref 70–99)
Potassium: 4.2 mEq/L (ref 3.5–5.1)
Sodium: 138 mEq/L (ref 135–145)
TOTAL PROTEIN: 7.1 g/dL (ref 6.0–8.3)
Total Bilirubin: 0.8 mg/dL (ref 0.2–1.2)

## 2018-04-27 LAB — TSH: TSH: 1.47 u[IU]/mL (ref 0.35–4.50)

## 2018-04-27 LAB — PSA: PSA: 0.69 ng/mL (ref 0.10–4.00)

## 2018-04-27 MED ORDER — CANDESARTAN CILEXETIL-HCTZ 16-12.5 MG PO TABS: 1.0000 | ORAL_TABLET | Freq: Every day | ORAL | 0 refills | Status: DC

## 2018-04-27 MED ORDER — TRAMADOL HCL 50 MG PO TABS: 50.0000 mg | ORAL_TABLET | Freq: Two times a day (BID) | ORAL | 2 refills | Status: DC | PRN

## 2018-04-27 NOTE — Patient Instructions (Signed)

## 2018-04-27 NOTE — Progress Notes (Signed)
Subjective:  Patient ID: Brad Hayes., male    DOB: 05-09-1960  Age: 58 y.o. MRN: [id number]  CC: Annual Exam and Hypertension   HPI Brad Hayes. presents for a CPX.  He does not monitor his blood pressure.  He tells me he has been taking the ARB/CCB combination.  He complains of weight gain but denies headache, blurred vision, CP, DOE, palpitations, edema, or fatigue.  Outpatient Medications Prior to Visit  Medication Sig Dispense Refill  . amLODipine-olmesartan (AZOR) 10-40 MG tablet Take 1 tablet by mouth daily. Patient needs appt with pcp before further refills 30 tablet 0  . cyclobenzaprine (FLEXERIL) 5 MG tablet Take 1 tablet (5 mg total) by mouth 3 (three) times daily as needed for muscle spasms. (Patient not taking: Reported on 04/27/2018) 30 tablet 2  . eszopiclone (LUNESTA) 2 MG TABS tablet Take 1 tablet (2 mg total) by mouth at bedtime as needed for sleep. Take immediately before bedtime (Patient not taking: Reported on 04/27/2018) 30 tablet 3  . traMADol (ULTRAM) 50 MG tablet Take 1 tablet (50 mg total) by mouth every 8 (eight) hours as needed. (Patient not taking: Reported on 04/27/2018) 30 tablet 0   No facility-administered medications prior to visit.     ROS Review of Systems  Constitutional: Positive for unexpected weight change (wt gain). Negative for appetite change, chills, diaphoresis and fatigue.  HENT: Negative.  Negative for trouble swallowing.   Eyes: Negative for visual disturbance.  Respiratory: Positive for apnea. Negative for cough, chest tightness, shortness of breath and wheezing.        He has a history of sleep apnea but is not currently treating it.  Cardiovascular: Negative for chest pain, palpitations and leg swelling.  Gastrointestinal: Negative for abdominal pain, constipation, diarrhea, nausea and vomiting.  Endocrine: Negative.  Negative for cold intolerance and heat intolerance.  Genitourinary: Negative.  Negative for difficulty  urinating, dysuria, penile swelling, scrotal swelling, testicular pain and urgency.  Musculoskeletal: Positive for back pain. Negative for arthralgias and myalgias.       He complains of chronic, unchanged, non-worsening, nonradiating low back pain.  He controls the pain with the occasional dose of tramadol.  Skin: Negative.  Negative for color change, pallor and rash.  Neurological: Negative.  Negative for dizziness, weakness, light-headedness and headaches.  Hematological: Negative for adenopathy. Does not bruise/bleed easily.  Psychiatric/Behavioral: Negative.     Objective:  BP (!) 174/106 (BP Location: Left Arm, Patient Position: Sitting, Cuff Size: Large)   Pulse 86   Temp 98.5 F (36.9 C) (Oral)   Resp 16   Ht 6' 1"  (1.854 m)   Wt 248 lb (112.5 kg)   SpO2 96%   BMI 32.72 kg/m   BP Readings from Last 3 Encounters:  04/27/18 (!) 174/106  07/04/17 130/80  04/11/17 132/82    Wt Readings from Last 3 Encounters:  04/27/18 248 lb (112.5 kg)  07/04/17 242 lb (109.8 kg)  04/11/17 243 lb 0.6 oz (110.2 kg)    Physical Exam Vitals signs reviewed.  Constitutional:      Appearance: He is obese. He is not ill-appearing or diaphoretic.  HENT:     Nose: Nose normal. No congestion or rhinorrhea.     Mouth/Throat:     Mouth: Mucous membranes are moist.     Pharynx: Oropharynx is clear. No oropharyngeal exudate or posterior oropharyngeal erythema.  Eyes:     General: No scleral icterus.    Conjunctiva/sclera: Conjunctivae normal.  Neck:  Musculoskeletal: Normal range of motion and neck supple. No muscular tenderness.  Cardiovascular:     Rate and Rhythm: Normal rate and regular rhythm.     Pulses: Normal pulses.     Heart sounds: No murmur. No gallop.      Comments: EKG ---  Sinus  Rhythm  WITHIN NORMAL LIMITS  Pulmonary:     Effort: Pulmonary effort is normal.     Breath sounds: Normal breath sounds. No wheezing, rhonchi or rales.  Abdominal:     General: Bowel  sounds are normal.     Palpations: There is no hepatomegaly, splenomegaly or mass.     Tenderness: There is no abdominal tenderness.  Genitourinary:    Comments: GU and rectal exams were deferred at his request.  He tells me he is seeing a urologist soon and will have the exam completed by the urologist. Musculoskeletal: Normal range of motion.        General: No swelling.     Right lower leg: No edema.     Left lower leg: No edema.  Lymphadenopathy:     Cervical: No cervical adenopathy.  Skin:    General: Skin is warm and dry.  Neurological:     General: No focal deficit present.     Mental Status: He is oriented to person, place, and time. Mental status is at baseline.     Lab Results  Component Value Date   WBC 5.3 04/27/2018   HGB 13.3 04/27/2018   HCT 39.6 04/27/2018   PLT 243.0 04/27/2018   GLUCOSE 109 (H) 04/27/2018   CHOL 124 04/27/2018   TRIG 104.0 04/27/2018   HDL 38.60 (L) 04/27/2018   LDLCALC 64 04/27/2018   ALT 98 (H) 04/27/2018   AST 133 (H) 04/27/2018   NA 138 04/27/2018   K 4.2 04/27/2018   CL 103 04/27/2018   CREATININE 1.06 04/27/2018   BUN 11 04/27/2018   CO2 26 04/27/2018   TSH 1.47 04/27/2018   PSA 0.69 04/27/2018   HGBA1C 4.9 07/04/2017    Dg Chest 2 View  Result Date: 11/11/2015 CLINICAL DATA:  Pt c/o left sided chest pains x 45 min. No other chest complaints. Hx of HTN. No previous sx. Non smoker. EXAM: CHEST  2 VIEW COMPARISON:  02/13/2015 FINDINGS: The heart size and mediastinal contours are within normal limits. Both lungs are clear. No pleural effusion or pneumothorax. The visualized skeletal structures are unremarkable. IMPRESSION: No active cardiopulmonary disease. Electronically Signed   By: Lajean Manes M.D.   On: 11/11/2015 13:11   Ct Angio Chest/abd/pel For Dissection W And/or W/wo  Result Date: 11/11/2015 CLINICAL DATA:  Left-sided chest pain extending to back this morning with no shortness-of-breath. EXAM: CT ANGIOGRAPHY CHEST,  ABDOMEN AND PELVIS TECHNIQUE: Multidetector CT imaging through the chest, abdomen and pelvis was performed using the standard protocol during bolus administration of intravenous contrast. Multiplanar reconstructed images including MIPs were obtained and reviewed to evaluate the vascular anatomy. Pre and post-contrast images were obtained. CONTRAST:  100 mL Isovue 370 IV COMPARISON:  Chest x-ray 11/11/2015 FINDINGS: CTA CHEST FINDINGS Mild ectasia of the ascending thoracic aorta measuring 3.5 cm in AP diameter. No evidence of aneurysm or dissection involving the thoracic aorta. Normal 3 vessel takeoff from the aortic arch. Pulmonary arterial system is within normal. Mild cardiomegaly. Remaining mediastinal structures are within normal. Lungs are well inflated without consolidation or effusion. Minimal linear atelectasis/scarring over the lingula. Airways are within normal. Review of the MIP images confirms  the above findings. CTA ABDOMEN AND PELVIS FINDINGS Abdominal aorta is normal caliber without dissection or aneurysm. The celiac axis, superior and inferior mesenteric arteries are patent. Single renal arteries are widely patent. Iliac and visualized femoral arteries are normal. The liver, spleen, pancreas, gallbladder and adrenal glands are within normal. Kidneys are normal in size without hydronephrosis or nephrolithiasis. There are a few bilateral renal cysts. Stomach, small bowel, appendix and colon are within normal. Mesentery is within normal. No adenopathy. Small umbilical hernia containing only peritoneal fat. Pelvic images demonstrate a normal bladder, prostate and rectum. Minimal degenerate change of the spine. Review of the MIP images confirms the above findings. IMPRESSION: CTA CHEST IMPRESSION No evidence of thoracic aortic aneurysm or dissection. No acute findings in the chest. Mild ectasia of the ascending thoracic aorta measuring 3.5 cm. Recommend annual imaging followup by CTA or MRA. This  recommendation follows 2010 ACCF/AHA/AATS/ACR/ASA/SCA/SCAI/SIR/STS/SVM Guidelines for the Diagnosis and Management of Patients with Thoracic Aortic Disease. Circulation.2010; 121: G644-I347. CTA ABDOMEN AND PELVIS IMPRESSION No evidence of aneurysm or dissection involving the abdominal aorta. No acute findings in the abdomen/pelvis. Bilateral renal cysts. Small umbilical hernia containing only peritoneal fat. Electronically Signed   By: Marin Olp M.D.   On: 11/11/2015 15:53    Assessment & Plan:   Kimball was seen today for annual exam and hypertension.  Diagnoses and all orders for this visit:  Essential hypertension, benign- His blood pressure is not adequately well controlled on the combination of an ARB and CCB.  I have asked him to upgrade to an ARB and thiazide diuretic.  His EKG is negative for LVH or ischemia.  His labs are negative for secondary causes or endorgan damage. -     Comprehensive metabolic panel; Future -     CBC with Differential/Platelet; Future -     TSH; Future -     Urinalysis, Routine w reflex microscopic; Future -     EKG 12-Lead -     candesartan-hydrochlorothiazide (ATACAND HCT) 16-12.5 MG tablet; Take 1 tablet by mouth daily.  Hyperglycemia- His blood sugar is very mildly elevated.  This does not require medical treatment. -     Comprehensive metabolic panel; Future  Routine general medical examination at a health care facility- Exam completed, labs reviewed, he refused a flu vaccine, he tells me he has an upcoming colonoscopy scheduled, patient education material was given. -     Lipid panel; Future -     PSA; Future -     HIV Antibody (routine testing w rflx); Future  Obstructive sleep apnea syndrome -     Ambulatory referral to Sleep Studies  History of colonic polyps -     Cancel: Ambulatory referral to Gastroenterology  Chronic bilateral low back pain without sciatica -     traMADol (ULTRAM) 50 MG tablet; Take 1 tablet (50 mg total) by mouth  every 12 (twelve) hours as needed.  Elevated LFTs- I have asked him to come back in to be screened for viral hepatitis.  Will check a GGT to see if there is concern for alcoholic hepatitis.  Will check a PT/INR to screen for hepatic failure.  I am also concerned about fatty liver disease so I have asked him to go undergo an abdominal ultrasound. -     Gamma GT; Future -     Hepatitis A antibody, total; Future -     Hepatitis B core antibody, total; Future -     Hepatitis B surface antibody,qualitative;  Future -     Hepatitis C antibody; Future -     Protime-INR; Future -     Cancel: US Abdomen Limited RUQ; Future -     US Abdomen Limited RUQ; Future   I have discontinued Morty Laforte Jr.'s eszopiclone, cyclobenzaprine, and amLODipine-olmesartan. I have also changed his traMADol. Additionally, I am having him start on candesartan-hydrochlorothiazide.  Meds ordered this encounter  Medications  . candesartan-hydrochlorothiazide (ATACAND HCT) 16-12.5 MG tablet    Sig: Take 1 tablet by mouth daily.    Dispense:  90 tablet    Refill:  0  . traMADol (ULTRAM) 50 MG tablet    Sig: Take 1 tablet (50 mg total) by mouth every 12 (twelve) hours as needed.    Dispense:  60 tablet    Refill:  2     Follow-up: Return in about 6 weeks (around 06/08/2018).  Scarlette Calico, MD

## 2018-04-28 ENCOUNTER — Encounter: Payer: Self-pay | Admitting: Internal Medicine

## 2018-04-28 LAB — HIV ANTIBODY (ROUTINE TESTING W REFLEX): HIV 1&2 Ab, 4th Generation: NONREACTIVE

## 2018-04-29 ENCOUNTER — Telehealth: Payer: Self-pay

## 2018-04-29 NOTE — Telephone Encounter (Signed)
Tried to do a PA for the tramadol.   Rx insurance information that I have is incorrect.

## 2018-04-29 NOTE — Telephone Encounter (Signed)
Pt informed I need his rx Environmental consultant. Pt stated that he will call me back 3.5.20 and give that information to me.

## 2018-05-08 ENCOUNTER — Other Ambulatory Visit: Payer: BC Managed Care – PPO

## 2018-05-12 ENCOUNTER — Ambulatory Visit
Admission: RE | Admit: 2018-05-12 | Discharge: 2018-05-12 | Disposition: A | Discharge status: Home / Self Care | Payer: BC Managed Care – PPO | Source: Ambulatory Visit | Attending: Internal Medicine | Admitting: Internal Medicine

## 2018-05-12 ENCOUNTER — Other Ambulatory Visit: Payer: Self-pay | Admitting: Internal Medicine

## 2018-05-12 ENCOUNTER — Other Ambulatory Visit: Payer: Self-pay

## 2018-05-12 ENCOUNTER — Encounter: Payer: Self-pay | Admitting: Internal Medicine

## 2018-05-12 DIAGNOSIS — R945 Abnormal results of liver function studies: Principal | ICD-10-CM

## 2018-05-12 DIAGNOSIS — R7989 Other specified abnormal findings of blood chemistry: Secondary | ICD-10-CM

## 2018-05-12 DIAGNOSIS — K7581 Nonalcoholic steatohepatitis (NASH): Secondary | ICD-10-CM

## 2018-05-12 MED ORDER — PIOGLITAZONE HCL 15 MG PO TABS: 15.0000 mg | ORAL_TABLET | Freq: Every day | ORAL | 1 refills | Status: DC

## 2018-05-26 ENCOUNTER — Other Ambulatory Visit: Payer: Self-pay | Admitting: Internal Medicine

## 2018-08-22 ENCOUNTER — Other Ambulatory Visit: Payer: Self-pay | Admitting: Internal Medicine

## 2018-08-22 DIAGNOSIS — I10 Essential (primary) hypertension: Secondary | ICD-10-CM

## 2018-10-22 ENCOUNTER — Ambulatory Visit (INDEPENDENT_AMBULATORY_CARE_PROVIDER_SITE_OTHER): Payer: BC Managed Care – PPO | Admitting: Internal Medicine

## 2018-10-22 ENCOUNTER — Other Ambulatory Visit (INDEPENDENT_AMBULATORY_CARE_PROVIDER_SITE_OTHER): Payer: BC Managed Care – PPO

## 2018-10-22 ENCOUNTER — Other Ambulatory Visit: Payer: Self-pay

## 2018-10-22 ENCOUNTER — Encounter: Payer: Self-pay | Admitting: Internal Medicine

## 2018-10-22 VITALS — BP 200/120 | HR 82 | Temp 98.2°F | Resp 16 | Ht 73.0 in | Wt 250.0 lb

## 2018-10-22 DIAGNOSIS — K4020 Bilateral inguinal hernia, without obstruction or gangrene, not specified as recurrent: Secondary | ICD-10-CM

## 2018-10-22 DIAGNOSIS — K7581 Nonalcoholic steatohepatitis (NASH): Secondary | ICD-10-CM | POA: Diagnosis not present

## 2018-10-22 DIAGNOSIS — I10 Essential (primary) hypertension: Secondary | ICD-10-CM

## 2018-10-22 DIAGNOSIS — F528 Other sexual dysfunction not due to a substance or known physiological condition: Secondary | ICD-10-CM

## 2018-10-22 LAB — BASIC METABOLIC PANEL
BUN: 12 mg/dL (ref 6–23)
CO2: 27 mEq/L (ref 19–32)
Calcium: 9.6 mg/dL (ref 8.4–10.5)
Chloride: 103 mEq/L (ref 96–112)
Creatinine, Ser: 1.17 mg/dL (ref 0.40–1.50)
GFR: 77.24 mL/min (ref >60.00)
Glucose, Bld: 107 mg/dL — ABNORMAL HIGH (ref 70–99)
Potassium: 3.9 mEq/L (ref 3.5–5.1)
Sodium: 139 mEq/L (ref 135–145)

## 2018-10-22 LAB — URINALYSIS, ROUTINE W REFLEX MICROSCOPIC
Bilirubin Urine: NEGATIVE
Hgb urine dipstick: NEGATIVE
Ketones, ur: NEGATIVE
Leukocytes,Ua: NEGATIVE
Nitrite: NEGATIVE
RBC / HPF: NONE SEEN (ref 0–?)
Specific Gravity, Urine: 1.025 (ref 1.000–1.030)
Total Protein, Urine: NEGATIVE
Urine Glucose: NEGATIVE
Urobilinogen, UA: 0.2 (ref 0.0–1.0)
pH: 5.5 (ref 5.0–8.0)

## 2018-10-22 LAB — HEPATIC FUNCTION PANEL
ALT: 29 U/L (ref 0–53)
AST: 23 U/L (ref 0–37)
Albumin: 4.7 g/dL (ref 3.5–5.2)
Alkaline Phosphatase: 54 U/L (ref 39–117)
Bilirubin, Direct: 0.3 mg/dL (ref 0.0–0.3)
Total Bilirubin: 1.2 mg/dL (ref 0.2–1.2)
Total Protein: 7.8 g/dL (ref 6.0–8.3)

## 2018-10-22 MED ORDER — NEBIVOLOL HCL 10 MG PO TABS: 10.0000 mg | ORAL_TABLET | Freq: Every day | ORAL | 0 refills | Status: DC

## 2018-10-22 MED ORDER — INDAPAMIDE 2.5 MG PO TABS: 2.5000 mg | ORAL_TABLET | Freq: Every day | ORAL | 0 refills | Status: DC

## 2018-10-22 MED ORDER — STENDRA 200 MG PO TABS: 1.0000 | ORAL_TABLET | ORAL | 5 refills | Status: DC | PRN

## 2018-10-22 NOTE — Progress Notes (Signed)
Subjective:  Patient ID: Brad Savannah., male    DOB: February 02, 1961  Age: 58 y.o. MRN: [id number]  CC: Hypertension   HPI Brad Keivon Garden. presents for f/up - He stopped taking his antihypertensives a few months ago because he thought it was causing erectile dysfunction.  He has not been monitoring his blood pressure recently but does complain of intermittent headaches.  He denies blurred vision, chest pain, shortness of breath, palpitations, edema, or fatigue.  He also complains of a one-month history of intermittent, uncomfortable bulge in both sides of his groin that is exacerbated by heavy lifting.  Outpatient Medications Prior to Visit  Medication Sig Dispense Refill  . pioglitazone (ACTOS) 15 MG tablet Take 1 tablet (15 mg total) by mouth daily. 90 tablet 1  . traMADol (ULTRAM) 50 MG tablet Take 1 tablet (50 mg total) by mouth every 12 (twelve) hours as needed. 60 tablet 2  . candesartan-hydrochlorothiazide (ATACAND HCT) 16-12.5 MG tablet TAKE 1 TABLET BY MOUTH EVERY DAY 90 tablet 0   No facility-administered medications prior to visit.     ROS Review of Systems  Constitutional: Negative.  Negative for diaphoresis and fatigue.  HENT: Negative.   Eyes: Negative for pain and visual disturbance.  Respiratory: Negative for cough, chest tightness, shortness of breath and wheezing.   Cardiovascular: Negative for chest pain, palpitations and leg swelling.  Gastrointestinal: Negative for abdominal pain, constipation, diarrhea, nausea and vomiting.  Endocrine: Negative.   Genitourinary: Negative.  Negative for difficulty urinating.  Musculoskeletal: Negative for arthralgias, back pain, myalgias and neck pain.  Skin: Negative.   Neurological: Positive for headaches. Negative for dizziness, weakness and light-headedness.  Hematological: Negative for adenopathy. Does not bruise/bleed easily.  Psychiatric/Behavioral: Negative.     Objective:  BP (!) 200/120 (BP Location: Left Arm,  Patient Position: Sitting, Cuff Size: Large)   Pulse 82   Temp 98.2 F (36.8 C) (Oral)   Resp 16   Ht 6' 1"  (1.854 m)   Wt 250 lb (113.4 kg)   SpO2 96%   BMI 32.98 kg/m   BP Readings from Last 3 Encounters:  10/22/18 (!) 200/120  04/27/18 (!) 174/106  07/04/17 130/80    Wt Readings from Last 3 Encounters:  10/22/18 250 lb (113.4 kg)  04/27/18 248 lb (112.5 kg)  07/04/17 242 lb (109.8 kg)    Physical Exam Vitals signs reviewed.  Constitutional:      Appearance: Normal appearance. He is obese. He is not ill-appearing or diaphoretic.  HENT:     Nose: Nose normal.     Mouth/Throat:     Mouth: Mucous membranes are moist.  Eyes:     General: No scleral icterus.    Conjunctiva/sclera: Conjunctivae normal.  Neck:     Musculoskeletal: Normal range of motion and neck supple.  Cardiovascular:     Rate and Rhythm: Normal rate and regular rhythm.     Heart sounds: No murmur.  Pulmonary:     Effort: Pulmonary effort is normal.     Breath sounds: No stridor. No wheezing, rhonchi or rales.  Abdominal:     General: Abdomen is protuberant. Bowel sounds are normal. There is no distension.     Palpations: There is no hepatomegaly or splenomegaly.     Tenderness: There is no abdominal tenderness.     Hernia: A hernia is present. Hernia is present in the left inguinal area and right inguinal area.  Genitourinary:    Pubic Area: No rash.  Penis: Normal and uncircumcised. No phimosis, paraphimosis, hypospadias, erythema, tenderness, discharge, swelling or lesions.      Scrotum/Testes: Normal.        Right: Mass, tenderness or swelling not present.        Left: Mass, tenderness or swelling not present.     Epididymis:     Right: Normal.     Left: Normal.     Comments: There are bilateral, direct inguinal hernias that are only noticed with Valsalva maneuver.  This is more prominent on the right than the left. Musculoskeletal: Normal range of motion.     Right lower leg: No edema.      Left lower leg: No edema.  Lymphadenopathy:     Lower Body: No right inguinal adenopathy. No left inguinal adenopathy.  Skin:    General: Skin is warm and dry.  Neurological:     General: No focal deficit present.     Mental Status: He is alert and oriented to person, place, and time.     Lab Results  Component Value Date   WBC 5.3 04/27/2018   HGB 13.3 04/27/2018   HCT 39.6 04/27/2018   PLT 243.0 04/27/2018   GLUCOSE 107 (H) 10/22/2018   CHOL 124 04/27/2018   TRIG 104.0 04/27/2018   HDL 38.60 (L) 04/27/2018   LDLCALC 64 04/27/2018   ALT 29 10/22/2018   AST 23 10/22/2018   NA 139 10/22/2018   K 3.9 10/22/2018   CL 103 10/22/2018   CREATININE 1.17 10/22/2018   BUN 12 10/22/2018   CO2 27 10/22/2018   TSH 1.47 04/27/2018   PSA 0.69 04/27/2018   HGBA1C 4.9 07/04/2017    US Abdomen Limited Ruq  Result Date: 05/12/2018 CLINICAL DATA:  Elevated liver enzymes EXAM: ULTRASOUND ABDOMEN LIMITED RIGHT UPPER QUADRANT COMPARISON:  None. FINDINGS: Gallbladder: No gallstones or wall thickening visualized. There is no pericholecystic fluid. No sonographic Murphy sign noted by sonographer. Common bile duct: Diameter: 4 mm. No intrahepatic or extrahepatic biliary duct dilatation. Liver: No focal lesion identified. Liver echogenicity overall is increased. Portal vein is patent on color Doppler imaging with normal direction of blood flow towards the liver. IMPRESSION: Diffuse increase in liver echogenicity, a finding felt to be indicative of hepatic steatosis. While no focal liver lesions are evident on this study, it must be cautioned that the sensitivity of ultrasound for detection of focal liver lesions is diminished in this circumstance. Study otherwise unremarkable. Electronically Signed   By: Lowella Grip III M.D.   On: 05/12/2018 14:00    Assessment & Plan:   Brad Hayes was seen today for hypertension.  Diagnoses and all orders for this visit:  Essential hypertension, benign- His  blood pressure is not adequately well controlled and he is symptomatic.  I will check his labs to screen for secondary causes.  I have asked him to start treating this with indapamide and nebivolol. -     Basic metabolic panel; Future -     indapamide (LOZOL) 2.5 MG tablet; Take 1 tablet (2.5 mg total) by mouth daily. -     nebivolol (BYSTOLIC) 10 MG tablet; Take 1 tablet (10 mg total) by mouth daily. -     Urinalysis, Routine w reflex microscopic; Future -     Aldosterone + renin activity w/ ratio; Future  NASH (nonalcoholic steatohepatitis)- His liver enzymes are normal now.  Screening for hep A and B infection is negative.  I recommended that he get vaccinated against hepatitis A and B. -  Hepatic function panel; Future -     Hepatitis A antibody, total; Future -     Hepatitis B core antibody, total; Future -     Hepatitis B surface antibody,qualitative; Future -     Hepatitis B surface antigen; Future  ERECTILE DYSFUNCTION- He has a modestly low testosterone level.  I have asked him to let me know if he wants to start using testosterone replacement therapy to treat the erectile dysfunction. -     Avanafil (STENDRA) 200 MG TABS; Take 1 tablet by mouth as needed. -     Testosterone Total,Free,Bio, Males; Future  Non-recurrent bilateral inguinal hernia without obstruction or gangrene -     Ambulatory referral to General Surgery   I have discontinued Ephrem Simao Jr.'s candesartan-hydrochlorothiazide. I am also having him start on Stendra, indapamide, and nebivolol. Additionally, I am having him maintain his traMADol and pioglitazone.  Meds ordered this encounter  Medications  . Avanafil (STENDRA) 200 MG TABS    Sig: Take 1 tablet by mouth as needed.    Dispense:  10 tablet    Refill:  5  . indapamide (LOZOL) 2.5 MG tablet    Sig: Take 1 tablet (2.5 mg total) by mouth daily.    Dispense:  90 tablet    Refill:  0  . nebivolol (BYSTOLIC) 10 MG tablet    Sig: Take 1 tablet (10 mg  total) by mouth daily.    Dispense:  90 tablet    Refill:  0     Follow-up: Return in about 3 weeks (around 11/12/2018).  Scarlette Calico, MD

## 2018-10-22 NOTE — Patient Instructions (Signed)

## 2018-10-24 ENCOUNTER — Encounter: Payer: Self-pay | Admitting: Internal Medicine

## 2018-10-27 ENCOUNTER — Ambulatory Visit: Payer: BC Managed Care – PPO | Admitting: Internal Medicine

## 2018-10-31 LAB — TESTOSTERONE TOTAL,FREE,BIO, MALES
Albumin: 4.7 g/dL (ref 3.6–5.1)
Sex Hormone Binding: 23 nmol/L (ref 22–77)
Testosterone: 201 ng/dL — ABNORMAL LOW (ref 250–827)

## 2018-10-31 LAB — HEPATITIS A ANTIBODY, TOTAL: Hepatitis A AB,Total: NONREACTIVE

## 2018-10-31 LAB — HEPATITIS B SURFACE ANTIBODY,QUALITATIVE: Hep B S Ab: NONREACTIVE

## 2018-10-31 LAB — ALDOSTERONE + RENIN ACTIVITY W/ RATIO
ALDO / PRA Ratio: 6.7 Ratio (ref 0.9–28.9)
Aldosterone: 8 ng/dL
Renin Activity: 1.2 ng/mL/h (ref 0.25–5.82)

## 2018-10-31 LAB — HEPATITIS B SURFACE ANTIGEN: Hepatitis B Surface Ag: NONREACTIVE

## 2018-10-31 LAB — HEPATITIS B CORE ANTIBODY, TOTAL: Hep B Core Total Ab: NONREACTIVE

## 2018-12-11 ENCOUNTER — Telehealth: Payer: Self-pay | Admitting: Emergency Medicine

## 2018-12-11 NOTE — Telephone Encounter (Signed)
Copied from [place] [id number]. 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 Please advise Best contact: [phone] Email: [email or web address]

## 2018-12-11 NOTE — Telephone Encounter (Signed)
SPoke with pt's wife and advised that he needs to schedule appt with [facility], if unable to get in with them next week to call back and we would schedule appt with another provider since Ronnald Ramp is on vacation.

## 2018-12-29 ENCOUNTER — Ambulatory Visit: Payer: Self-pay | Admitting: Surgery

## 2019-01-13 ENCOUNTER — Other Ambulatory Visit: Payer: Self-pay | Admitting: Internal Medicine

## 2019-01-13 DIAGNOSIS — I10 Essential (primary) hypertension: Secondary | ICD-10-CM

## 2019-02-23 NOTE — Pre-Procedure Instructions (Signed)
Your procedure is scheduled on Tuesday, January 5th, from 08:00 AM to 10:00 AM.  Report to [facility]tr[facility] "A" at 06:00 A.M, and check in at the Admitting office.  Call this number if you have problems the morning of surgery:  [phone]  Call [phone] if you have any questions prior to your surgery date Monday-Friday 8am-4pm   Remember:  Do not eat or drink after midnight the night before your surgery   Take these medicines the morning of surgery with A SIP OF WATER : BYSTOLIC  IF NEEDED: acetaminophen (TYLENOL)  As of today, STOP taking any Aspirin (unless otherwise instructed by your surgeon), Aleve, Naproxen, Ibuprofen, Motrin, Advil, Goody's, BC's, all herbal medications, fish oil, and all vitamins.    The Morning of Surgery  Do not wear jewelry.  Do not wear lotions, powders, colognes, or deodorant  Men may shave face and neck.  Do not bring valuables to the hospital.  [facility] is not responsible for any belongings or valuables.  If you are a smoker, DO NOT Smoke 24 hours prior to surgery  If you wear a CPAP at night please bring your mask, tubing, and machine the morning of surgery   Remember that you must have someone to transport you home after your surgery, and remain with you for 24 hours if you are discharged the same day.   Please bring cases for contacts, glasses, hearing aids, dentures or bridgework because it cannot be worn into surgery.    Leave your suitcase in the car.  After surgery it may be brought to your room.  For patients admitted to the hospital, discharge time will be determined by your treatment team.  Patients discharged the day of surgery will not be allowed to drive home.    Special instructions:   [place] [place]- Preparing For Surgery  Before surgery, you can play an important role. Because skin is not sterile, your skin needs to be as free of germs as possible. You can reduce the number of germs on your skin by washing  with CHG (chlorahexidine gluconate) Soap before surgery.  CHG is an antiseptic cleaner which kills germs and bonds with the skin to continue killing germs even after washing.    Oral Hygiene is also important to reduce your risk of infection.  Remember - BRUSH YOUR TEETH THE MORNING OF SURGERY WITH YOUR REGULAR TOOTHPASTE  Please do not use if you have an allergy to CHG or antibacterial soaps. If your skin becomes reddened/irritated stop using the CHG.  Do not shave (including legs and underarms) for at least 48 hours prior to first CHG shower. It is OK to shave your face.  Please follow these instructions carefully.   1. Shower the NIGHT BEFORE SURGERY and the MORNING OF SURGERY with CHG Soap.   2. If you chose to wash your hair, wash your hair first as usual with your normal shampoo.  3. After you shampoo, rinse your hair and body thoroughly to remove the shampoo.  4. Use CHG as you would any other liquid soap. You can apply CHG directly to the skin and wash gently with a scrungie or a clean washcloth.   5. Apply the CHG Soap to your body ONLY FROM THE NECK DOWN.  Do not use on open wounds or open sores. Avoid contact with your eyes, ears, mouth and genitals (private parts). Wash Face and genitals (private parts)  with your normal soap.   6. Wash thoroughly, paying  special attention to the area where your surgery will be performed.  7. Thoroughly rinse your body with warm water from the neck down.  8. DO NOT shower/wash with your normal soap after using and rinsing off the CHG Soap.  9. Pat yourself dry with a CLEAN TOWEL.  10. Wear CLEAN PAJAMAS to bed the night before surgery, wear comfortable clothes the morning of surgery  11. Place CLEAN SHEETS on your bed the night of your first shower and DO NOT SLEEP WITH PETS.    Day of Surgery:   Remember to brush your teeth WITH YOUR REGULAR TOOTHPASTE. Please shower the morning of surgery with the CHG soap Do not apply any  deodorants/lotions. Please wear clean clothes to the hospital/surgery center.      Please read over the following fact sheets that you were given.

## 2019-02-24 ENCOUNTER — Encounter (HOSPITAL_COMMUNITY): Payer: Self-pay

## 2019-02-24 ENCOUNTER — Other Ambulatory Visit: Payer: Self-pay

## 2019-02-24 ENCOUNTER — Encounter (HOSPITAL_COMMUNITY)
Admission: RE | Admit: 2019-02-24 | Discharge: 2019-02-24 | Disposition: A | Discharge status: Home / Self Care | Payer: BC Managed Care – PPO | Source: Ambulatory Visit | Attending: Surgery | Admitting: Surgery

## 2019-02-24 DIAGNOSIS — Z01812 Encounter for preprocedural laboratory examination: Secondary | ICD-10-CM | POA: Diagnosis present

## 2019-02-24 DIAGNOSIS — Z9114 Patient's other noncompliance with medication regimen: Secondary | ICD-10-CM | POA: Insufficient documentation

## 2019-02-24 DIAGNOSIS — I10 Essential (primary) hypertension: Secondary | ICD-10-CM | POA: Diagnosis not present

## 2019-02-24 LAB — BASIC METABOLIC PANEL
Anion gap: 14 (ref 5–15)
BUN: 13 mg/dL (ref 6–20)
CO2: 21 mmol/L — ABNORMAL LOW (ref 22–32)
Calcium: 9.2 mg/dL (ref 8.9–10.3)
Chloride: 106 mmol/L (ref 98–111)
Creatinine, Ser: 1.19 mg/dL (ref 0.61–1.24)
GFR calc Af Amer: >60 mL/min (ref >60)
GFR calc non Af Amer: >60 mL/min (ref >60)
Glucose, Bld: 119 mg/dL — ABNORMAL HIGH (ref 70–99)
Potassium: 3.9 mmol/L (ref 3.5–5.1)
Sodium: 141 mmol/L (ref 135–145)

## 2019-02-24 LAB — CBC
HCT: 40.4 % (ref 39.0–52.0)
Hemoglobin: 13.5 g/dL (ref 13.0–17.0)
MCH: 30 pg (ref 26.0–34.0)
MCHC: 33.4 g/dL (ref 30.0–36.0)
MCV: 89.8 fL (ref 80.0–100.0)
Platelets: 251 10*3/uL (ref 150–400)
RBC: 4.5 MIL/uL (ref 4.22–5.81)
RDW: 12.2 % (ref 11.5–15.5)
WBC: 5.7 10*3/uL (ref 4.0–10.5)
nRBC: 0 % (ref 0.0–0.2)

## 2019-02-24 NOTE — Progress Notes (Addendum)
PCP - Dr. Scarlette Calico Cardiologist - denies  Pt with elevated BP at PAT visit-186/106; repeat check 183/100. Patient has been off his Bystolic x 2 weeks. Pt instructed to contact his PCP and inform of the above with recommendations for restarting his medication. Patient is aware if he has elevated BP the DOS his procedure has potential to be cancelled/rescheduled.  Chest x-ray - N/A EKG - 04/27/18 Stress Test - denies ECHO - denies Cardiac Cath - denies  Sleep Study - h/o OSA CPAP - does not use  Aspirin Instructions: Patient instructed to hold all Aspirin, NSAID's, herbal medications, fish oil and vitamins 7 days prior to surgery.   ERAS Protcol -N/A PRE-SURGERY Ensure or G2- N/A  COVID TEST- 02/26/18 @ [facility]. Pt aware to remain in their car. Educated on self quarantine following Covid test.   Anesthesia review: elevated BP  Patient denies shortness of breath, fever, cough and chest pain at PAT appointment   All instructions explained to the patient, with a verbal understanding of the material. Patient agrees to go over the instructions while at home for a better understanding. Patient also instructed to self quarantine after being tested for COVID-19. The opportunity to ask questions was provided.

## 2019-02-25 NOTE — Anesthesia Preprocedure Evaluation (Addendum)
Anesthesia Evaluation  Patient identified by MRN, date of birth, ID band  Reviewed: Allergy & Precautions, NPO status , Patient's Chart, lab work & pertinent test results  Airway Mallampati: II  TM Distance: >3 FB Neck ROM: Full    Dental no notable dental hx. (+) Teeth Intact, Loose   Pulmonary sleep apnea ,    Pulmonary exam normal breath sounds clear to auscultation       Cardiovascular hypertension, Pt. on medications and Pt. on home beta blockers Normal cardiovascular exam Rhythm:Regular Rate:Normal  EKG 05/15/18 SR R 79 LVH   Neuro/Psych negative neurological ROS  negative psych ROS   GI/Hepatic negative GI ROS,   Endo/Other  diabetes, Type 2  Renal/GU K+ 3.9 Hgb 1.19     Musculoskeletal negative musculoskeletal ROS (+)   Abdominal (+) + obese,   Peds  Hematology Hgb 13.5   Anesthesia Other Findings NKDA  Reproductive/Obstetrics                           Anesthesia Physical Anesthesia Plan  ASA: III  Anesthesia Plan: General   Post-op Pain Management:    Induction: Intravenous  PONV Risk Score and Plan: 3 and Treatment may vary due to age or medical condition, Midazolam and Ondansetron  Airway Management Planned: Oral ETT  Additional Equipment:   Intra-op Plan:   Post-operative Plan: Extubation in OR  Informed Consent: I have reviewed the patients History and Physical, chart, labs and discussed the procedure including the risks, benefits and alternatives for the proposed anesthesia with the patient or authorized representative who has indicated his/her understanding and acceptance.     Dental advisory given  Plan Discussed with:   Anesthesia Plan Comments: (Pt with elevated BP at PAT visit-186/106; repeat check 183/100. Patient reported he had been off his nebivolol x 2 weeks, unclear why. PCP note from Dr. Ronnald Ramp 10/22/18 states pt had stopped antiHTN meds at that time  because he thought it was causing erectile dysfunciton. BP in office at that visit was 200/120 and he was noted to have headaches. He was prescribed indapamide and nebivolol. Bp 169/100  I called and spoke with Abigail Butts at [facility] to advise that pt currently noncompliant and has uncontrolled HTN. [facility] reached out to pt and he reported that after his PAT appt he picked up refills for both medications and has restarted them. He understands uncontrolled HTN on DOS could be cause for cancellation.   Preop labs reviewed, WNL.   EKG 04/27/18: NSR. Rate 79.)      Anesthesia Quick Evaluation

## 2019-02-25 NOTE — Progress Notes (Signed)
Anesthesia Chart Review:  Pt with elevated BP at PAT visit-186/106; repeat check 183/100. Patient reported he had been off his nebivolol x 2 weeks, unclear why. PCP note from Dr. Ronnald Ramp 10/22/18 states pt had stopped antiHTN meds at that time because he thought it was causing erectile dysfunciton. BP in office at that visit was 200/120 and he was noted to have headaches. He was prescribed indapamide and nebivolol.   I called and spoke with Abigail Butts at [facility] to advise that pt currently noncompliant and has uncontrolled HTN. [facility] reached out to pt and he reported that after his PAT appt he picked up refills for both medications and has restarted them. He understands uncontrolled HTN on DOS could be cause for cancellation.   Preop labs reviewed, WNL.  EKG 04/27/18: NSR. Rate 15.   Wynonia Musty [facility] Short Stay Center/Anesthesiology Phone [phone] 02/25/2019 12:32 PM

## 2019-02-27 ENCOUNTER — Other Ambulatory Visit (HOSPITAL_COMMUNITY)
Admission: RE | Admit: 2019-02-27 | Discharge: 2019-02-27 | Disposition: A | Discharge status: Home / Self Care | Payer: BC Managed Care – PPO | Source: Ambulatory Visit | Attending: Surgery | Admitting: Surgery

## 2019-02-27 DIAGNOSIS — Z01812 Encounter for preprocedural laboratory examination: Secondary | ICD-10-CM | POA: Insufficient documentation

## 2019-02-27 DIAGNOSIS — Z20822 Contact with and (suspected) exposure to covid-19: Secondary | ICD-10-CM | POA: Diagnosis not present

## 2019-02-27 LAB — SARS CORONAVIRUS 2 (TAT 6-24 HRS): SARS Coronavirus 2: NEGATIVE

## 2019-03-02 ENCOUNTER — Ambulatory Visit (HOSPITAL_COMMUNITY): Payer: BC Managed Care – PPO | Admitting: Physician Assistant

## 2019-03-02 ENCOUNTER — Ambulatory Visit (HOSPITAL_COMMUNITY)
Admission: RE | Admit: 2019-03-02 | Discharge: 2019-03-02 | Disposition: A | Discharge status: Home / Self Care | Payer: BC Managed Care – PPO | Attending: Surgery | Admitting: Surgery

## 2019-03-02 ENCOUNTER — Encounter (HOSPITAL_COMMUNITY)
Admission: RE | Disposition: A | Discharge status: Home / Self Care | Payer: Self-pay | Source: Home / Self Care | Attending: Surgery

## 2019-03-02 ENCOUNTER — Other Ambulatory Visit: Payer: Self-pay

## 2019-03-02 ENCOUNTER — Encounter (HOSPITAL_COMMUNITY): Payer: Self-pay | Admitting: Surgery

## 2019-03-02 DIAGNOSIS — Z79899 Other long term (current) drug therapy: Secondary | ICD-10-CM | POA: Insufficient documentation

## 2019-03-02 DIAGNOSIS — I10 Essential (primary) hypertension: Secondary | ICD-10-CM | POA: Insufficient documentation

## 2019-03-02 DIAGNOSIS — K4030 Unilateral inguinal hernia, with obstruction, without gangrene, not specified as recurrent: Secondary | ICD-10-CM | POA: Insufficient documentation

## 2019-03-02 DIAGNOSIS — G4733 Obstructive sleep apnea (adult) (pediatric): Secondary | ICD-10-CM | POA: Insufficient documentation

## 2019-03-02 DIAGNOSIS — K420 Umbilical hernia with obstruction, without gangrene: Secondary | ICD-10-CM | POA: Insufficient documentation

## 2019-03-02 DIAGNOSIS — E119 Type 2 diabetes mellitus without complications: Secondary | ICD-10-CM | POA: Insufficient documentation

## 2019-03-02 HISTORY — PX: UMBILICAL HERNIA REPAIR: SHX196

## 2019-03-02 HISTORY — PX: INGUINAL HERNIA REPAIR: SHX194

## 2019-03-02 LAB — GLUCOSE, CAPILLARY: Glucose-Capillary: 187 mg/dL — ABNORMAL HIGH (ref 70–99)

## 2019-03-02 SURGERY — REPAIR, HERNIA, INGUINAL, LAPAROSCOPIC
Anesthesia: General

## 2019-03-02 SURGERY — REPAIR, HERNIA, INGUINAL, LAPAROSCOPIC
Anesthesia: General | Site: Abdomen

## 2019-03-02 MED ORDER — ONDANSETRON HCL 4 MG/2ML IJ SOLN: 4.0000 mg | Freq: Once | INTRAMUSCULAR | Status: DC | PRN

## 2019-03-02 MED ORDER — CHLORHEXIDINE GLUCONATE CLOTH 2 % EX PADS: 6.0000 | MEDICATED_PAD | Freq: Once | CUTANEOUS | Status: DC

## 2019-03-02 MED ORDER — PROPOFOL 10 MG/ML IV BOLUS
INTRAVENOUS | Status: DC | PRN
  Administered 2019-03-02: 150 mg via INTRAVENOUS

## 2019-03-02 MED ORDER — PHENYLEPHRINE 40 MCG/ML (10ML) SYRINGE FOR IV PUSH (FOR BLOOD PRESSURE SUPPORT)
PREFILLED_SYRINGE | INTRAVENOUS | Status: AC
  Filled 2019-03-02: qty 10

## 2019-03-02 MED ORDER — 0.9 % SODIUM CHLORIDE (POUR BTL) OPTIME
TOPICAL | Status: DC | PRN
  Administered 2019-03-02: 1000 mL

## 2019-03-02 MED ORDER — KETOROLAC TROMETHAMINE 30 MG/ML IJ SOLN
INTRAMUSCULAR | Status: AC
  Filled 2019-03-02: qty 1

## 2019-03-02 MED ORDER — CEFAZOLIN SODIUM-DEXTROSE 2-4 GM/100ML-% IV SOLN
2.0000 g | INTRAVENOUS | Status: AC
  Administered 2019-03-02: 2 g via INTRAVENOUS

## 2019-03-02 MED ORDER — ONDANSETRON HCL 4 MG/2ML IJ SOLN
INTRAMUSCULAR | Status: DC | PRN
  Administered 2019-03-02: 4 mg via INTRAVENOUS

## 2019-03-02 MED ORDER — IBUPROFEN 800 MG PO TABS: 800.0000 mg | ORAL_TABLET | Freq: Three times a day (TID) | ORAL | 0 refills | Status: DC | PRN

## 2019-03-02 MED ORDER — ENOXAPARIN SODIUM 40 MG/0.4ML ~~LOC~~ SOLN: 40.0000 mg | Freq: Once | SUBCUTANEOUS | Status: AC

## 2019-03-02 MED ORDER — KETOROLAC TROMETHAMINE 30 MG/ML IJ SOLN
30.0000 mg | Freq: Once | INTRAMUSCULAR | Status: AC | PRN
  Administered 2019-03-02: 30 mg via INTRAVENOUS

## 2019-03-02 MED ORDER — OXYCODONE HCL 5 MG PO TABS: 5.0000 mg | ORAL_TABLET | ORAL | 0 refills | Status: DC | PRN

## 2019-03-02 MED ORDER — ACETAMINOPHEN 500 MG PO TABS
ORAL_TABLET | ORAL | Status: AC
  Administered 2019-03-02: 07:00:00 1000 mg via ORAL
  Filled 2019-03-02: qty 2

## 2019-03-02 MED ORDER — LIDOCAINE 2% (20 MG/ML) 5 ML SYRINGE
INTRAMUSCULAR | Status: DC | PRN
  Administered 2019-03-02: 100 mg via INTRAVENOUS

## 2019-03-02 MED ORDER — OXYCODONE HCL 5 MG PO TABS
5.0000 mg | ORAL_TABLET | Freq: Once | ORAL | Status: AC | PRN
  Administered 2019-03-02: 5 mg via ORAL

## 2019-03-02 MED ORDER — MIDAZOLAM HCL 5 MG/5ML IJ SOLN
INTRAMUSCULAR | Status: DC | PRN
  Administered 2019-03-02: 2 mg via INTRAVENOUS

## 2019-03-02 MED ORDER — LACTATED RINGERS IV SOLN: INTRAVENOUS | Status: DC

## 2019-03-02 MED ORDER — OXYCODONE HCL 5 MG/5ML PO SOLN: 5.0000 mg | Freq: Once | ORAL | Status: AC | PRN

## 2019-03-02 MED ORDER — FENTANYL CITRATE (PF) 100 MCG/2ML IJ SOLN
INTRAMUSCULAR | Status: DC | PRN
  Administered 2019-03-02 (×4): 50 ug via INTRAVENOUS

## 2019-03-02 MED ORDER — ENOXAPARIN SODIUM 40 MG/0.4ML ~~LOC~~ SOLN
SUBCUTANEOUS | Status: AC
  Administered 2019-03-02: 07:00:00 40 mg via SUBCUTANEOUS
  Filled 2019-03-02: qty 0.4

## 2019-03-02 MED ORDER — ROCURONIUM BROMIDE 50 MG/5ML IV SOSY
PREFILLED_SYRINGE | INTRAVENOUS | Status: DC | PRN
  Administered 2019-03-02: 50 mg via INTRAVENOUS
  Administered 2019-03-02: 10 mg via INTRAVENOUS
  Administered 2019-03-02: 30 mg via INTRAVENOUS

## 2019-03-02 MED ORDER — BUPIVACAINE HCL (PF) 0.25 % IJ SOLN
INTRAMUSCULAR | Status: AC
  Filled 2019-03-02: qty 30

## 2019-03-02 MED ORDER — OXYCODONE HCL 5 MG PO TABS
ORAL_TABLET | ORAL | Status: AC
  Filled 2019-03-02: qty 1

## 2019-03-02 MED ORDER — BUPIVACAINE HCL (PF) 0.25 % IJ SOLN: INTRAMUSCULAR | Status: DC | PRN

## 2019-03-02 MED ORDER — HYDROMORPHONE HCL 1 MG/ML IJ SOLN: 0.2500 mg | INTRAMUSCULAR | Status: DC | PRN

## 2019-03-02 MED ORDER — CELECOXIB 200 MG PO CAPS: 200.0000 mg | ORAL_CAPSULE | Freq: Once | ORAL | Status: AC

## 2019-03-02 MED ORDER — DEXAMETHASONE SODIUM PHOSPHATE 10 MG/ML IJ SOLN
INTRAMUSCULAR | Status: DC | PRN
  Administered 2019-03-02: 4 mg via INTRAVENOUS

## 2019-03-02 MED ORDER — FENTANYL CITRATE (PF) 250 MCG/5ML IJ SOLN
INTRAMUSCULAR | Status: AC
  Filled 2019-03-02: qty 5

## 2019-03-02 MED ORDER — PHENYLEPHRINE HCL (PRESSORS) 10 MG/ML IV SOLN
INTRAVENOUS | Status: DC | PRN
  Administered 2019-03-02 (×2): 80 ug via INTRAVENOUS

## 2019-03-02 MED ORDER — CELECOXIB 200 MG PO CAPS
ORAL_CAPSULE | ORAL | Status: AC
  Administered 2019-03-02: 07:00:00 200 mg via ORAL
  Filled 2019-03-02: qty 1

## 2019-03-02 MED ORDER — EPHEDRINE SULFATE-NACL 50-0.9 MG/10ML-% IV SOSY
PREFILLED_SYRINGE | INTRAVENOUS | Status: DC | PRN
  Administered 2019-03-02 (×2): 10 mg via INTRAVENOUS

## 2019-03-02 MED ORDER — ACETAMINOPHEN 500 MG PO TABS: 1000.0000 mg | ORAL_TABLET | Freq: Once | ORAL | Status: AC

## 2019-03-02 MED ORDER — CEFAZOLIN SODIUM-DEXTROSE 2-4 GM/100ML-% IV SOLN
INTRAVENOUS | Status: AC
  Filled 2019-03-02: qty 100

## 2019-03-02 MED ORDER — MIDAZOLAM HCL 2 MG/2ML IJ SOLN
INTRAMUSCULAR | Status: AC
  Filled 2019-03-02: qty 2

## 2019-03-02 MED ORDER — SUGAMMADEX SODIUM 200 MG/2ML IV SOLN
INTRAVENOUS | Status: DC | PRN
  Administered 2019-03-02: 300 mg via INTRAVENOUS

## 2019-03-02 MED ORDER — ONDANSETRON HCL 4 MG/2ML IJ SOLN
INTRAMUSCULAR | Status: AC
  Filled 2019-03-02: qty 2

## 2019-03-02 MED ORDER — PROPOFOL 10 MG/ML IV BOLUS
INTRAVENOUS | Status: AC
  Filled 2019-03-02: qty 20

## 2019-03-02 MED ORDER — PHENYLEPHRINE HCL-NACL 10-0.9 MG/250ML-% IV SOLN
INTRAVENOUS | Status: DC | PRN
  Administered 2019-03-02: 25 ug/min via INTRAVENOUS

## 2019-03-02 MED ORDER — STERILE WATER FOR IRRIGATION IR SOLN
Status: DC | PRN
  Administered 2019-03-02: 1000 mL

## 2019-03-02 SURGICAL SUPPLY — 77 items
ADH SKN CLS APL DERMABOND .7 (GAUZE/BANDAGES/DRESSINGS) ×2
APL PRP STRL LF DISP 70% ISPRP (MISCELLANEOUS) ×2
APL SKNCLS STERI-STRIP NONHPOA (GAUZE/BANDAGES/DRESSINGS)
APPLIER CLIP 5 13 M/L LIGAMAX5 (MISCELLANEOUS)
APPLIER CLIP ROT 10 11.4 M/L (STAPLE)
APR CLP MED LRG 11.4X10 (STAPLE)
APR CLP MED LRG 5 ANG JAW (MISCELLANEOUS)
BENZOIN TINCTURE PRP APPL 2/3 (GAUZE/BANDAGES/DRESSINGS) IMPLANT
BLADE CLIPPER SURG (BLADE) IMPLANT
CANISTER SUCT 3000ML PPV (MISCELLANEOUS) IMPLANT
CHLORAPREP W/TINT 26 (MISCELLANEOUS) ×4 IMPLANT
CLIP APPLIE 5 13 M/L LIGAMAX5 (MISCELLANEOUS) IMPLANT
CLIP APPLIE ROT 10 11.4 M/L (STAPLE) IMPLANT
COVER SURGICAL LIGHT HANDLE (MISCELLANEOUS) ×4 IMPLANT
COVER WAND RF STERILE (DRAPES) ×1 IMPLANT
DERMABOND ADVANCED (GAUZE/BANDAGES/DRESSINGS) ×2
DERMABOND ADVANCED .7 DNX12 (GAUZE/BANDAGES/DRESSINGS) ×2 IMPLANT
DEVICE RELIATACK FIXATION (MISCELLANEOUS) ×1 IMPLANT
DEVICE SECURE STRAP 25 ABSORB (INSTRUMENTS) ×4 IMPLANT
DEVICE TROCAR PUNCTURE CLOSURE (ENDOMECHANICALS) ×4 IMPLANT
DISSECT BALLN SPACEMKR + OVL (BALLOONS) ×4
DISSECTOR BALLN SPACEMKR + OVL (BALLOONS) ×1 IMPLANT
DRAPE INCISE IOBAN 66X45 STRL (DRAPES) ×1 IMPLANT
DRSG TEGADERM 4X4.75 (GAUZE/BANDAGES/DRESSINGS) IMPLANT
ELECT REM PT RETURN 9FT ADLT (ELECTROSURGICAL) ×4
ELECTRODE REM PT RTRN 9FT ADLT (ELECTROSURGICAL) ×2 IMPLANT
GAUZE SPONGE 2X2 8PLY STRL LF (GAUZE/BANDAGES/DRESSINGS) ×2 IMPLANT
GLOVE BIO SURGEON STRL SZ 6.5 (GLOVE) ×8 IMPLANT
GLOVE BIO SURGEON STRL SZ7 (GLOVE) ×7 IMPLANT
GLOVE BIO SURGEONS STRL SZ 6.5 (GLOVE) ×4
GLOVE BIOGEL M STRL SZ7.5 (GLOVE) ×4 IMPLANT
GLOVE BIOGEL PI IND STRL 7.5 (GLOVE) IMPLANT
GLOVE BIOGEL PI INDICATOR 7.5 (GLOVE)
GLOVE ECLIPSE 6.5 STRL STRAW (GLOVE) ×3 IMPLANT
GLOVE INDICATOR 8.0 STRL GRN (GLOVE) ×4 IMPLANT
GOWN STRL REUS W/ TWL LRG LVL3 (GOWN DISPOSABLE) ×7 IMPLANT
GOWN STRL REUS W/TWL 2XL LVL3 (GOWN DISPOSABLE) ×4 IMPLANT
GOWN STRL REUS W/TWL LRG LVL3 (GOWN DISPOSABLE) ×16
KIT BASIN OR (CUSTOM PROCEDURE TRAY) ×4 IMPLANT
KIT TURNOVER KIT B (KITS) ×4 IMPLANT
MARKER SKIN DUAL TIP RULER LAB (MISCELLANEOUS) ×4 IMPLANT
MESH 3DMAX 5X7 RT XLRG (Mesh General) ×3 IMPLANT
MESH VENTRALEX ST 2.5 CRC MED (Mesh General) ×3 IMPLANT
NDL SPNL 22GX3.5 QUINCKE BK (NEEDLE) ×1 IMPLANT
NEEDLE SPNL 22GX3.5 QUINCKE BK (NEEDLE) ×4 IMPLANT
NS IRRIG 1000ML POUR BTL (IV SOLUTION) ×4 IMPLANT
PACK GENERAL/GYN (CUSTOM PROCEDURE TRAY) ×3 IMPLANT
PAD ARMBOARD 7.5X6 YLW CONV (MISCELLANEOUS) ×8 IMPLANT
RELOAD ENDO RELIATCK 10 HERNIA (MISCELLANEOUS) IMPLANT
RELOAD ENDO RELIATCK 5 HERNIA (MISCELLANEOUS) IMPLANT
RELOAD RELIATACK 10 (MISCELLANEOUS) IMPLANT
RELOAD RELIATACK 5 (MISCELLANEOUS) IMPLANT
SCISSORS LAP 5X35 DISP (ENDOMECHANICALS) ×4 IMPLANT
SET IRRIG TUBING LAPAROSCOPIC (IRRIGATION / IRRIGATOR) IMPLANT
SET TUBE SMOKE EVAC HIGH FLOW (TUBING) ×4 IMPLANT
SHEARS HARMONIC ACE PLUS 36CM (ENDOMECHANICALS) IMPLANT
SLEEVE ENDOPATH XCEL 5M (ENDOMECHANICALS) ×11 IMPLANT
SPONGE GAUZE 2X2 STER 10/PKG (GAUZE/BANDAGES/DRESSINGS) ×2
SUT MNCRL AB 4-0 PS2 18 (SUTURE) ×7 IMPLANT
SUT NOVA NAB GS-21 0 18 T12 DT (SUTURE) ×3 IMPLANT
SUT NOVAFIL 0 T 12 (SUTURE) ×3 IMPLANT
SUT PROLENE 0 CT 1 CR/8 (SUTURE) ×1 IMPLANT
SUT VIC AB 0 CT1 27 (SUTURE)
SUT VIC AB 0 CT1 27XBRD ANBCTR (SUTURE) IMPLANT
SUT VIC AB 2-0 CT1 27 (SUTURE) ×4
SUT VIC AB 2-0 CT1 TAPERPNT 27 (SUTURE) ×1 IMPLANT
SUT VIC AB 2-0 SH 18 (SUTURE) ×3 IMPLANT
SUT VICRYL 0 UR6 27IN ABS (SUTURE) ×3 IMPLANT
TOWEL GREEN STERILE (TOWEL DISPOSABLE) ×4 IMPLANT
TOWEL GREEN STERILE FF (TOWEL DISPOSABLE) ×4 IMPLANT
TRAY FOLEY MTR SLVR 14FR STAT (SET/KITS/TRAYS/PACK) IMPLANT
TROCAR BALLN 12MMX100 BLUNT (TROCAR) ×3 IMPLANT
TROCAR XCEL BLADELESS 5X75MML (TROCAR) ×8 IMPLANT
TROCAR XCEL BLUNT TIP 100MML (ENDOMECHANICALS) ×4 IMPLANT
TROCAR XCEL NON-BLD 11X100MML (ENDOMECHANICALS) IMPLANT
TROCAR XCEL NON-BLD 5MMX100MML (ENDOMECHANICALS) ×4 IMPLANT
WATER STERILE IRR 1000ML POUR (IV SOLUTION) ×4 IMPLANT

## 2019-03-02 NOTE — Progress Notes (Signed)
Dr. Valma Cava made aware patient arrived to short stay with elevated BP.  No orders received.  Will continue to monitor patient.

## 2019-03-02 NOTE — Transfer of Care (Signed)
Immediate Anesthesia Transfer of Care Note  Patient: Brad Hayes.  Procedure(s) Performed: LAPAROSCOPIC RIGHT INGUINAL HERNIA REPAIR (N/A Abdomen) UMBILICAL HERNIA REPAIR WITH MESH (N/A Abdomen)  Patient Location: PACU  Anesthesia Type:General  Level of Consciousness: awake and drowsy  Airway & Oxygen Therapy: Patient Spontanous Breathing and Patient connected to face mask oxygen  Post-op Assessment: Report given to RN and Post -op Vital signs reviewed and stable  Post vital signs: Reviewed and stable  Last Vitals:  Vitals Value Taken Time  BP 175/102 03/02/19 1024  Temp    Pulse 94 03/02/19 1026  Resp 20 03/02/19 1026  SpO2 89 % 03/02/19 1026  Vitals shown include unvalidated device data.  Last Pain:  Vitals:   03/02/19 1025  PainSc: (P) Asleep         Complications: No apparent anesthesia complications

## 2019-03-02 NOTE — H&P (Signed)
     Brad Hayes. is an 59 y.o. male.   HPI: 60M with LIH and umbilical hernia, presents for surgical repair. Hypertensive in PAT, non-adherent to medication therapy. Restarted meds after this, remains hypertensive today, but improved. The patient has had no hospitalizations, surgeries, or newly diagnosed allergies since being seen in clinic.    Past Medical History:  Diagnosis Date  . Chronic insomnia   . Hypertension   . OSA (obstructive sleep apnea)     Past Surgical History:  Procedure Laterality Date  . dental extraction      Family History  Problem Relation Age of Onset  . Hypertension Mother   . Sickle cell anemia Mother   . Alcohol abuse Father   . Hypertension Sister   . Hypertension Brother   . Stroke Neg Hx   . Kidney disease Neg Hx   . Heart disease Neg Hx   . Hyperlipidemia Neg Hx   . Early death Neg Hx   . Diabetes Neg Hx   . COPD Neg Hx   . Cancer Neg Hx     Social History:  reports that he has never smoked. He has never used smokeless tobacco. He reports current alcohol use of about 5.0 standard drinks of alcohol per week. He reports that he does not use drugs.  Allergies: No Known Allergies  Medications: I have reviewed the patient's current medications.  No results found for this or any previous visit (from the past 48 hour(s)).  No results found.  ROS 10 point review of systems is negative except as listed above in HPI.   Physical Exam Blood pressure (!) 183/100, pulse 74, temperature 98.5 F (36.9 C), resp. rate 18, height 5' 11"  (1.803 m), weight 112.9 kg, SpO2 96 %. Gen: comfortable, no distress Neuro: non-focal exam HEENT: PERRL Neck: supple CV: RRR Pulm: unlabored breathing Abd: soft, TTP at umbilicus, with umbilical hernia GU: LIH Extr: wwp, no edema     Assessment/Plan: 22J with umbilical and LIH. To OR for lap LIH repair, possible RIH repair, and umbilical hernia repair, all with mesh. Informed consent obtained after  detailed explanation of risks, including bleeding, infection, mesh infection requiring explantation, inguinodynia, need for conversion to open procedure. All questions answered to the patient's satisfaction. Plan for d/c home post-op.    Jesusita Oka, MD General and [facility]

## 2019-03-02 NOTE — Discharge Instructions (Addendum)
May shower beginning 03/03/2019. Do not peel off or scrub skin glue. May allow warm soapy water to run over incision, then rinse and pat dry. Do not soak in any water (tubs, hot tubs, pools, lakes, oceans) for one week.   Absolutely NO lifting greater than 5 pounds for six weeks. No exceptions. Wear your abdominal binder at all times when out of bed.   Call the office at [phone] for temperature greater than 101.16F, worsening pain, redness or warmth at the incision site.  Please call [phone] to make an appointment for 1 week after surgery for wound check.

## 2019-03-02 NOTE — Op Note (Signed)
   Operative Note   Date: 03/02/2019  Procedure: laparoscopic right inguinal hernia repair with mesh, open umbilical hernia repair with mesh  Pre-op diagnosis: right incarcerated inguinal hernia, incarcerated umbilical hernia Post-op diagnosis: same  Indication and clinical history: The patient is a 59 y.o. year old male with umbilical and RIH.     Surgeon: Jesusita Oka, MD Assistant: Georgette Dover, MD  Anesthesia: General  Findings:  . Specimen: none . EBL: <10cc . Drains/Implants: 6.4cm circular patch mesh, 3D max XL-right  Disposition: PACU - hemodynamically stable.  Description of procedure: The patient was positioned supine on the operating room table. General anesthetic induction and intubation were uneventful. Foley catheter insertion was performed and was atraumatic. Time-out was performed verifying correct patient, procedure, signature of informed consent, and administration of pre-operative antibiotics, VTE prophylaxis with low molecular weight heparin.   An infraumbilical incision was made and the right retro-rectus space exposed. A balloon dissector was inserted and the inflated under direct visualization. The balloon was deflated and the pre-peritoneal space insufflated. Two 36m ports were placed under direct visualization midway between the pubis and the umbilical port and laterally on the right, above the ASIS. Dissection commenced, exposing an indirect, fat-containing, incarcerated right inguinal hernia. This was reduced and a 3D max XL right sided mesh was inserted. This was positioned to cover the direct, indirect, and femoral spaces.Two absorbable tacks were used to affix the mesh to the abdominal wall. The left pre-peritoneal space was explored and no hernia was identified on the left. The pre-peritoneal space was desufflated and the two 526mports removed under direct visualization. Next the umbilical incision was extended cranially to expose an incarcerated fat-containing  umbilical hernia. The hernia was incised and the fascia cleared circumferentially using electrocautery. A 6.4cm circular patch mesh was inserted and fixed in four quadrants with novofil suture. The defect was then closed primarily using a running novofil suture. The anterior fascial incision used for the inguinal hernia repair was closed with 2-0 vicryl. The dead space of the midline incsion was obliterated with 2-0 vicryl suture and the skin closed with 4-0 monocryl. Additional 4-0 monocryl was used to close the 6m31mort sites.   Sterile dressings were applied using dermabond. All sponge and instrument counts were correct at the conclusion of the procedure. The patient was awakened from anesthesia, extubated uneventfully, and transported to the PACU in good condition. There were no complications.    AyeJesusita OkaD General and [facility]

## 2019-03-02 NOTE — Anesthesia Procedure Notes (Signed)
Procedure Name: Intubation Date/Time: 03/02/2019 8:05 AM Performed by: Inda Coke, CRNA Pre-anesthesia Checklist: Patient identified, Emergency Drugs available, Suction available and Patient being monitored Patient Re-evaluated:Patient Re-evaluated prior to induction Oxygen Delivery Method: Circle System Utilized Preoxygenation: Pre-oxygenation with 100% oxygen Induction Type: IV induction Ventilation: Mask ventilation without difficulty and Oral airway inserted - appropriate to patient size Laryngoscope Size: Mac and 4 Grade View: Grade I Tube type: Oral Tube size: 7.5 mm Number of attempts: 1 Airway Equipment and Method: Stylet and Oral airway Placement Confirmation: ETT inserted through vocal cords under direct vision,  positive ETCO2 and breath sounds checked- equal and bilateral Secured at: 23 cm Tube secured with: Tape Dental Injury: Teeth and Oropharynx as per pre-operative assessment

## 2019-03-02 NOTE — Anesthesia Postprocedure Evaluation (Signed)
Anesthesia Post Note  Patient: [name].  Procedure(s) Performed: LAPAROSCOPIC RIGHT INGUINAL HERNIA REPAIR (N/A Abdomen) UMBILICAL HERNIA REPAIR WITH MESH (N/A Abdomen)     Patient location during evaluation: PACU Anesthesia Type: General Level of consciousness: awake and alert Pain management: pain level controlled Vital Signs Assessment: post-procedure vital signs reviewed and stable Respiratory status: spontaneous breathing, nonlabored ventilation, respiratory function stable and patient connected to nasal cannula oxygen Cardiovascular status: blood pressure returned to baseline and stable Postop Assessment: no apparent nausea or vomiting Anesthetic complications: no    Last Vitals:  Vitals:   03/02/19 1100 03/02/19 1110  BP:  132/80  Pulse:  75  Resp:  17  Temp:  36.9 C  SpO2: 95% 97%    Last Pain:  Vitals:   03/02/19 1110  PainSc: [place] A Levon Penning

## 2019-03-03 ENCOUNTER — Ambulatory Visit: Admit: 2019-03-03 | Payer: BC Managed Care – PPO | Admitting: Surgery

## 2019-03-15 ENCOUNTER — Encounter: Payer: Self-pay | Admitting: *Deleted

## 2019-04-21 ENCOUNTER — Encounter: Payer: Self-pay | Admitting: Internal Medicine

## 2019-04-21 ENCOUNTER — Telehealth: Payer: Self-pay

## 2019-04-21 NOTE — Telephone Encounter (Signed)
New message     The patient stating Dr. Ronnald Ramp has called in some cough medication for him before the patient does not know the name of the cough medicine.     [facility] [address]

## 2019-04-22 ENCOUNTER — Ambulatory Visit: Payer: BC Managed Care – PPO | Admitting: Internal Medicine

## 2019-04-22 ENCOUNTER — Ambulatory Visit (INDEPENDENT_AMBULATORY_CARE_PROVIDER_SITE_OTHER): Payer: BC Managed Care – PPO | Admitting: Internal Medicine

## 2019-04-22 DIAGNOSIS — R059 Cough, unspecified: Secondary | ICD-10-CM

## 2019-04-22 DIAGNOSIS — R05 Cough: Secondary | ICD-10-CM | POA: Diagnosis not present

## 2019-04-22 DIAGNOSIS — J3089 Other allergic rhinitis: Secondary | ICD-10-CM

## 2019-04-22 DIAGNOSIS — F528 Other sexual dysfunction not due to a substance or known physiological condition: Secondary | ICD-10-CM | POA: Diagnosis not present

## 2019-04-22 MED ORDER — MONTELUKAST SODIUM 10 MG PO TABS: 10.0000 mg | ORAL_TABLET | Freq: Every day | ORAL | 0 refills | Status: DC

## 2019-04-22 MED ORDER — SILDENAFIL CITRATE 100 MG PO TABS: 100.0000 mg | ORAL_TABLET | Freq: Every day | ORAL | 0 refills | Status: DC | PRN

## 2019-04-22 MED ORDER — HYDROCOD POLST-CPM POLST ER 10-8 MG/5ML PO SUER: 5.0000 mL | Freq: Two times a day (BID) | ORAL | 0 refills | Status: DC | PRN

## 2019-04-22 NOTE — Telephone Encounter (Signed)
Pt contacted and scheduled for virtual visit with Dr. Sharlet Salina.

## 2019-04-22 NOTE — Progress Notes (Signed)
Virtual Visit via Video Note  I connected with Brad Hayes. on 04/22/19 at  2:40 PM EST by a video enabled telemedicine application and verified that I am speaking with the correct person using two identifiers.  The patient and the provider were at separate locations throughout the entire encounter.   I discussed the limitations of evaluation and management by telemedicine and the availability of in person appointments. The patient expressed understanding and agreed to proceed. The patient and the provider were the only parties present for the visit unless noted in HPI below.  History of Present Illness: The patient is a 59 y.o. man with visit for several concerns including allergies (having drainage and cough, this typically happens this time of year, has not tried anything otc, overall stable, cough and drainage for several weeks now, no fevers or chills) and cough (mostly this time of year and has been going on for several weeks, denies fevers or chills, denies SOB, cough non-productive, no headaches, some sinus drainage, has not tried anything, states typically his pcp calls in some cough medicine which is the only thing that works, denies exposure to covid-19 or wanting to get tested for this currently) and ED (prior rx for stendra which did not work well, wants to try something else).   Observations/Objective: Appearance: normal, mild cough during visit non-productive, breathing appears normal, casual grooming, abdomen does not appear distended, throat clear drainage, memory normal, mental status is A and O times 3  Assessment and Plan: See problem oriented charting  Follow Up Instructions: rx 5 day supply tussionex per request, rx singulair, rx generic viagra for ED  I discussed the assessment and treatment plan with the patient. The patient was provided an opportunity to ask questions and all were answered. The patient agreed with the plan and demonstrated an understanding of the  instructions.   The patient was advised to call back or seek an in-person evaluation if the symptoms worsen or if the condition fails to improve as anticipated.  Hoyt Koch, MD

## 2019-04-23 ENCOUNTER — Encounter: Payer: Self-pay | Admitting: Internal Medicine

## 2019-04-23 NOTE — Telephone Encounter (Signed)
Concern was addressed at virtual visit with [place].

## 2019-04-23 NOTE — Assessment & Plan Note (Signed)
Rx singulair as he does not wish to try any otc medications.

## 2019-04-23 NOTE — Assessment & Plan Note (Signed)
Rx viagra 100 mg to try for ED. Follow up with PCP.

## 2019-04-23 NOTE — Assessment & Plan Note (Signed)
Advised while likely related to allergies he should consider covid-19 testing which he does not want to do. Rx tussionex 5 days supply after review of [place] narcotic database. Rx singulair to address underlying cause.

## 2019-05-05 ENCOUNTER — Telehealth: Payer: Self-pay

## 2019-05-05 ENCOUNTER — Other Ambulatory Visit: Payer: Self-pay | Admitting: Internal Medicine

## 2019-05-05 DIAGNOSIS — J4540 Moderate persistent asthma, uncomplicated: Secondary | ICD-10-CM

## 2019-05-05 MED ORDER — DULERA 200-5 MCG/ACT IN AERO: 2.0000 | INHALATION_SPRAY | Freq: Two times a day (BID) | RESPIRATORY_TRACT | 5 refills | Status: DC

## 2019-05-05 NOTE — Telephone Encounter (Signed)
RX sent for dulera If he is still coughing then I will need to see him in person

## 2019-05-05 NOTE — Telephone Encounter (Signed)
New message    The patient verbalized can Dr. Ronnald Ramp prescribe him an inhaler due to allergies. The patient states this was called in before.    1.Medication Requested:chlorpheniramine-HYDROcodone (TUSSIONEX PENNKINETIC ER) 10-8 MG/5ML SUER  2. Pharmacy (Name, [place], [place]):[id number] [place], [place] - [address]. On Med List: Yes   4. Last Visit with PCP: seen Dr. CSharlet Salina2.25.21 for the same issues   5. Next visit date with PCP: unknown    Agent: Please be advised that RX refills may take up to 3 business days. We ask that you follow-up with your pharmacy.

## 2019-05-05 NOTE — Telephone Encounter (Signed)
Pt is requesting an rx for [facility].   Pt is also requesting a refill of the Tussionex Dr. Sharlet Salina rx'ed on 2.23.21.  Please advise.

## 2019-05-05 NOTE — Telephone Encounter (Signed)
Spoke to pt and informed that pt would need to be seen for evaluation of what is causing his cough. Pt did not want to make appointment. I informed that PCP would not rx til he is seen. First available with PCP was 05/12/2019. Pt was not happy about that. Pt office respiratory clinic but patient was not comfortable with that and stated that he does not have COVID and that it was allergy related. Requested that pt get a COVID test prior to his appt. 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 Pt wanted to know what he is suppose to due until the 17th. Ask pt if he was taking an allergy medication. Pt stated that he used to have an inhaler. I informed pt that the inhaler has been rx'ed to pt pharmacy.

## 2019-05-07 NOTE — Telephone Encounter (Signed)
    Patient calling to discuss inhaler, stating insurance will not cover  Please call

## 2019-05-10 NOTE — Telephone Encounter (Signed)
Key: [id number]

## 2019-05-10 NOTE — Telephone Encounter (Signed)
Pt has picked up samples.

## 2019-05-12 ENCOUNTER — Encounter: Payer: Self-pay | Admitting: Internal Medicine

## 2019-05-12 ENCOUNTER — Other Ambulatory Visit: Payer: Self-pay

## 2019-05-12 ENCOUNTER — Ambulatory Visit: Payer: BC Managed Care – PPO | Admitting: Internal Medicine

## 2019-05-12 VITALS — BP 164/96 | HR 87 | Temp 98.5°F | Resp 16 | Ht 71.0 in | Wt 244.5 lb

## 2019-05-12 DIAGNOSIS — J4541 Moderate persistent asthma with (acute) exacerbation: Secondary | ICD-10-CM | POA: Diagnosis not present

## 2019-05-12 DIAGNOSIS — K7581 Nonalcoholic steatohepatitis (NASH): Secondary | ICD-10-CM | POA: Diagnosis not present

## 2019-05-12 DIAGNOSIS — I10 Essential (primary) hypertension: Secondary | ICD-10-CM | POA: Diagnosis not present

## 2019-05-12 DIAGNOSIS — R05 Cough: Secondary | ICD-10-CM | POA: Diagnosis not present

## 2019-05-12 DIAGNOSIS — E876 Hypokalemia: Secondary | ICD-10-CM

## 2019-05-12 DIAGNOSIS — T502X5A Adverse effect of carbonic-anhydrase inhibitors, benzothiadiazides and other diuretics, initial encounter: Secondary | ICD-10-CM

## 2019-05-12 DIAGNOSIS — R059 Cough, unspecified: Secondary | ICD-10-CM

## 2019-05-12 LAB — POCT EXHALED NITRIC OXIDE: FeNO level (ppb): 11

## 2019-05-12 LAB — CBC WITH DIFFERENTIAL/PLATELET
Basophils Absolute: 0 10*3/uL (ref 0.0–0.1)
Basophils Relative: 0.5 % (ref 0.0–3.0)
Eosinophils Absolute: 0.1 10*3/uL (ref 0.0–0.7)
Eosinophils Relative: 0.8 % (ref 0.0–5.0)
HCT: 39.3 % (ref 39.0–52.0)
Hemoglobin: 13.1 g/dL (ref 13.0–17.0)
Lymphocytes Relative: 52.1 % — ABNORMAL HIGH (ref 12.0–46.0)
Lymphs Abs: 3.6 10*3/uL (ref 0.7–4.0)
MCHC: 33.3 g/dL (ref 30.0–36.0)
MCV: 89.3 fl (ref 78.0–100.0)
Monocytes Absolute: 0.7 10*3/uL (ref 0.1–1.0)
Monocytes Relative: 10.4 % (ref 3.0–12.0)
Neutro Abs: 2.5 10*3/uL (ref 1.4–7.7)
Neutrophils Relative %: 36.2 % — ABNORMAL LOW (ref 43.0–77.0)
Platelets: 247 10*3/uL (ref 150.0–400.0)
RBC: 4.4 Mil/uL (ref 4.22–5.81)
RDW: 13.1 % (ref 11.5–15.5)
WBC: 6.9 10*3/uL (ref 4.0–10.5)

## 2019-05-12 LAB — HEPATIC FUNCTION PANEL
ALT: 35 U/L (ref 0–53)
AST: 41 U/L — ABNORMAL HIGH (ref 0–37)
Albumin: 4.6 g/dL (ref 3.5–5.2)
Alkaline Phosphatase: 45 U/L (ref 39–117)
Bilirubin, Direct: 0.2 mg/dL (ref 0.0–0.3)
Total Bilirubin: 1.1 mg/dL (ref 0.2–1.2)
Total Protein: 8 g/dL (ref 6.0–8.3)

## 2019-05-12 LAB — BASIC METABOLIC PANEL
BUN: 15 mg/dL (ref 6–23)
CO2: 32 mEq/L (ref 19–32)
Calcium: 9.9 mg/dL (ref 8.4–10.5)
Chloride: 99 mEq/L (ref 96–112)
Creatinine, Ser: 1.25 mg/dL (ref 0.40–1.50)
GFR: 71.43 mL/min (ref >60.00)
Glucose, Bld: 107 mg/dL — ABNORMAL HIGH (ref 70–99)
Potassium: 3.3 mEq/L — ABNORMAL LOW (ref 3.5–5.1)
Sodium: 140 mEq/L (ref 135–145)

## 2019-05-12 MED ORDER — POTASSIUM CHLORIDE CRYS ER 20 MEQ PO TBCR: 20.0000 meq | EXTENDED_RELEASE_TABLET | Freq: Two times a day (BID) | ORAL | 0 refills | Status: DC

## 2019-05-12 MED ORDER — NEBIVOLOL HCL 10 MG PO TABS: 10.0000 mg | ORAL_TABLET | Freq: Every day | ORAL | 0 refills | Status: DC

## 2019-05-12 MED ORDER — INDAPAMIDE 2.5 MG PO TABS: 2.5000 mg | ORAL_TABLET | Freq: Every day | ORAL | 0 refills | Status: DC

## 2019-05-12 MED ORDER — MONTELUKAST SODIUM 10 MG PO TABS: 10.0000 mg | ORAL_TABLET | Freq: Every day | ORAL | 1 refills | Status: DC

## 2019-05-12 MED ORDER — HYDROCOD POLST-CPM POLST ER 10-8 MG/5ML PO SUER: 5.0000 mL | Freq: Two times a day (BID) | ORAL | 0 refills | Status: DC | PRN

## 2019-05-12 MED ORDER — TRELEGY ELLIPTA 100-62.5-25 MCG/INH IN AEPB: 1.0000 | INHALATION_SPRAY | Freq: Every day | RESPIRATORY_TRACT | 1 refills | Status: DC

## 2019-05-12 NOTE — Patient Instructions (Signed)

## 2019-05-12 NOTE — Progress Notes (Signed)
Subjective:  Patient ID: Brad Savannah., male    DOB: 1960/04/11  Age: 59 y.o. MRN: [id number]  CC: Cough and Hypertension   This visit occurred during the SARS-CoV-2 public health emergency.  Safety protocols were in place, including screening questions prior to the visit, additional usage of staff PPE, and extensive cleaning of exam room while observing appropriate contact time as indicated for disinfecting solutions.    HPI Brad Hayes. presents for f/up - He complains of a several week history of nonproductive cough and wheezing.  He tells me that Ruthe Mannan is not helping.  He denies chest pain, fever, chills, diaphoresis, dizziness, or lightheadedness.  He did a virtual visit with someone else a few weeks ago and was prescribed Tussionex suspension which he says has helped with the symptoms.  He requests a refill.  He tells me his blood pressure is not adequately well controlled.  He thinks he is taking one of the antihypertensives but not both of them.  Outpatient Medications Prior to Visit  Medication Sig Dispense Refill  . acetaminophen (TYLENOL) 500 MG tablet Take 500-1,000 mg by mouth every 6 (six) hours as needed.    . sildenafil (VIAGRA) 100 MG tablet Take 1 tablet (100 mg total) by mouth daily as needed for erectile dysfunction. 10 tablet 0  . ibuprofen (ADVIL) 800 MG tablet Take 1 tablet (800 mg total) by mouth every 8 (eight) hours as needed. Take with food. 60 tablet 0  . indapamide (LOZOL) 2.5 MG tablet TAKE 1 TABLET BY MOUTH EVERY DAY (Patient taking differently: Take 2.5 mg by mouth daily. ) 90 tablet 0  . mometasone-formoterol (DULERA) 200-5 MCG/ACT AERO Inhale 2 puffs into the lungs 2 (two) times daily. 13 g 5  . montelukast (SINGULAIR) 10 MG tablet Take 1 tablet (10 mg total) by mouth at bedtime. 30 tablet 0  . pioglitazone (ACTOS) 15 MG tablet Take 1 tablet (15 mg total) by mouth daily. 90 tablet 1  . BYSTOLIC 10 MG tablet TAKE 1 TABLET BY MOUTH EVERY DAY (Patient  not taking: No sig reported) 90 tablet 0  . chlorpheniramine-HYDROcodone (TUSSIONEX PENNKINETIC ER) 10-8 MG/5ML SUER Take 5 mLs by mouth every 12 (twelve) hours as needed for cough. (Patient not taking: Reported on 05/12/2019) 50 mL 0   No facility-administered medications prior to visit.    ROS Review of Systems  Constitutional: Negative for chills, diaphoresis, fatigue and fever.  HENT: Negative.   Respiratory: Positive for cough and wheezing. Negative for chest tightness, shortness of breath and stridor.   Cardiovascular: Negative for chest pain, palpitations and leg swelling.  Gastrointestinal: Negative for abdominal pain, constipation, diarrhea, nausea and vomiting.  Endocrine: Negative.   Genitourinary: Negative.  Negative for difficulty urinating.  Musculoskeletal: Negative.  Negative for arthralgias and myalgias.  Skin: Negative.  Negative for color change and pallor.  Neurological: Negative for dizziness, weakness, light-headedness and headaches.  Hematological: Negative for adenopathy. Does not bruise/bleed easily.  Psychiatric/Behavioral: Negative.     Objective:  BP (!) 164/96 (BP Location: Left Arm, Patient Position: Sitting, Cuff Size: Large)   Pulse 87   Temp 98.5 F (36.9 C) (Oral)   Resp 16   Ht 5' 11"  (1.803 m)   Wt 244 lb 8 oz (110.9 kg)   SpO2 94%   BMI 34.10 kg/m   BP Readings from Last 3 Encounters:  05/12/19 (!) 164/96  03/02/19 132/80  02/24/19 (!) 186/106    Wt Readings from Last 3 Encounters:  05/12/19 244 lb 8 oz (110.9 kg)  03/02/19 249 lb (112.9 kg)  02/24/19 249 lb 6.4 oz (113.1 kg)    Physical Exam Vitals reviewed.  Constitutional:      Appearance: Normal appearance.  HENT:     Nose: Nose normal.     Mouth/Throat:     Mouth: Mucous membranes are moist.     Pharynx: No oropharyngeal exudate.  Eyes:     General: No scleral icterus.    Conjunctiva/sclera: Conjunctivae normal.  Cardiovascular:     Rate and Rhythm: Normal rate and  regular rhythm.     Heart sounds: No murmur.  Pulmonary:     Effort: Pulmonary effort is normal.     Breath sounds: No stridor. No wheezing, rhonchi or rales.  Abdominal:     General: Abdomen is flat. Bowel sounds are normal. There is no distension.     Palpations: Abdomen is soft. There is no hepatomegaly, splenomegaly or mass.     Tenderness: There is no abdominal tenderness.  Musculoskeletal:        General: Normal range of motion.     Cervical back: Neck supple.     Right lower leg: No edema.     Left lower leg: No edema.  Lymphadenopathy:     Cervical: No cervical adenopathy.  Skin:    General: Skin is warm and dry.     Coloration: Skin is not pale.  Neurological:     General: No focal deficit present.     Mental Status: He is alert.  Psychiatric:        Mood and Affect: Mood normal.        Behavior: Behavior normal.     Lab Results  Component Value Date   WBC 6.9 05/12/2019   HGB 13.1 05/12/2019   HCT 39.3 05/12/2019   PLT 247.0 05/12/2019   GLUCOSE 107 (H) 05/12/2019   CHOL 124 04/27/2018   TRIG 104.0 04/27/2018   HDL 38.60 (L) 04/27/2018   LDLCALC 64 04/27/2018   ALT 35 05/12/2019   AST 41 (H) 05/12/2019   NA 140 05/12/2019   K 3.3 (L) 05/12/2019   CL 99 05/12/2019   CREATININE 1.25 05/12/2019   BUN 15 05/12/2019   CO2 32 05/12/2019   TSH 1.47 04/27/2018   PSA 0.69 04/27/2018   HGBA1C 4.9 07/04/2017    No results found.  Assessment & Plan:   Brad Hayes was seen today for cough and hypertension.  Diagnoses and all orders for this visit:  Essential hypertension, benign- His blood pressure is not adequately well controlled.  He has developed hypokalemia.  I have asked him to be compliant with though both indapamide and nebivolol. -     indapamide (LOZOL) 2.5 MG tablet; Take 1 tablet (2.5 mg total) by mouth daily. -     nebivolol (BYSTOLIC) 10 MG tablet; Take 1 tablet (10 mg total) by mouth daily. -     CBC with Differential/Platelet -     Basic  metabolic panel  Cough- Will check a chest x-ray to screen for mass or infiltrate. -     DG Chest 2 View; Future -     POCT EXHALED NITRIC OXIDE -     chlorpheniramine-HYDROcodone (TUSSIONEX PENNKINETIC ER) 10-8 MG/5ML SUER; Take 5 mLs by mouth every 12 (twelve) hours as needed for cough.  NASH (nonalcoholic steatohepatitis)- His LFTs remain elevated.  I have asked him to improve his lifestyle modifications and to restart pioglitazone. -  Hepatic function panel -     pioglitazone (ACTOS) 15 MG tablet; Take 1 tablet (15 mg total) by mouth daily.  Moderate persistent asthma with acute exacerbation- His FeNO score is not elevated so I do not think he would benefit from a course of systemic steroids.  I have asked him to upgrade to a triple inhaler. -     montelukast (SINGULAIR) 10 MG tablet; Take 1 tablet (10 mg total) by mouth at bedtime. -     Fluticasone-Umeclidin-Vilant (TRELEGY ELLIPTA) 100-62.5-25 MCG/INH AEPB; Inhale 1 puff into the lungs daily. -     chlorpheniramine-HYDROcodone (TUSSIONEX PENNKINETIC ER) 10-8 MG/5ML SUER; Take 5 mLs by mouth every 12 (twelve) hours as needed for cough.  Diuretic-induced hypokalemia -     potassium chloride SA (KLOR-CON) 20 MEQ tablet; Take 1 tablet (20 mEq total) by mouth 2 (two) times daily.   I have discontinued Brad Estala Jr.'s ibuprofen and Dulera. I have changed his Bystolic to nebivolol. I have also changed his indapamide. Additionally, I am having him start on Trelegy Ellipta and potassium chloride SA. Lastly, I am having him maintain his acetaminophen, sildenafil, montelukast, chlorpheniramine-HYDROcodone, and pioglitazone.  Meds ordered this encounter  Medications  . indapamide (LOZOL) 2.5 MG tablet    Sig: Take 1 tablet (2.5 mg total) by mouth daily.    Dispense:  90 tablet    Refill:  0  . montelukast (SINGULAIR) 10 MG tablet    Sig: Take 1 tablet (10 mg total) by mouth at bedtime.    Dispense:  90 tablet    Refill:  1  .  nebivolol (BYSTOLIC) 10 MG tablet    Sig: Take 1 tablet (10 mg total) by mouth daily.    Dispense:  90 tablet    Refill:  0  . Fluticasone-Umeclidin-Vilant (TRELEGY ELLIPTA) 100-62.5-25 MCG/INH AEPB    Sig: Inhale 1 puff into the lungs daily.    Dispense:  90 each    Refill:  1  . chlorpheniramine-HYDROcodone (TUSSIONEX PENNKINETIC ER) 10-8 MG/5ML SUER    Sig: Take 5 mLs by mouth every 12 (twelve) hours as needed for cough.    Dispense:  70 mL    Refill:  0  . potassium chloride SA (KLOR-CON) 20 MEQ tablet    Sig: Take 1 tablet (20 mEq total) by mouth 2 (two) times daily.    Dispense:  180 tablet    Refill:  0  . pioglitazone (ACTOS) 15 MG tablet    Sig: Take 1 tablet (15 mg total) by mouth daily.    Dispense:  90 tablet    Refill:  1     Follow-up: Return in about 4 weeks (around 06/09/2019).  Scarlette Calico, MD

## 2019-05-13 ENCOUNTER — Encounter: Payer: Self-pay | Admitting: Internal Medicine

## 2019-05-14 MED ORDER — PIOGLITAZONE HCL 15 MG PO TABS: 15.0000 mg | ORAL_TABLET | Freq: Every day | ORAL | 1 refills | Status: DC

## 2019-05-20 ENCOUNTER — Ambulatory Visit: Payer: BC Managed Care – PPO | Attending: Family

## 2019-05-20 DIAGNOSIS — Z23 Encounter for immunization: Secondary | ICD-10-CM

## 2019-05-20 NOTE — Progress Notes (Signed)
   [id number]  Name:  Brad Hayes.    MRN: [id number] DOB: Jan 23, 1961  05/20/2019  Brad Hayes was observed post Covid-19 immunization for 15 minutes without incident. He was provided with Vaccine Information Sheet and instruction to access the V-Safe system.   Brad Hayes was instructed to call 911 with any severe reactions post vaccine: Marland Kitchen Difficulty breathing  . Swelling of face and throat  . A fast heartbeat  . A bad rash all over body  . Dizziness and weakness   Immunizations Administered    Name Date Dose VIS Date Route   Moderna COVID-19 Vaccine 05/20/2019  3:09 PM 0.5 mL 01/26/2019 Intramuscular   Manufacturer: Moderna   Lot: [id number]   [place]: 03496-116-43

## 2019-06-07 ENCOUNTER — Telehealth: Payer: Self-pay | Admitting: Internal Medicine

## 2019-06-07 NOTE — Telephone Encounter (Signed)
Call to patient and informed an appointment is needed for rx for cough syrup and he was requested to come back in 4-6 weeks to check K+ level. Pt was not happy about coughing and the OTC meds were not helping. Pt informed that the FENO was normal last and he needed to be evaluated for reason for cough, with possible another FENO and/or chest.   Informed patient that he saw the mychart message requesting the follow up appointment on 05/14/2019. Pt agreed to scheduled and has been scheduled for Tuesday 06/08/2019 at 2pm

## 2019-06-07 NOTE — Telephone Encounter (Signed)
   Patient states he has started to cough again. Requesting refill . 1.Medication Requested:chlorpheniramine-HYDROcodone (TUSSIONEX PENNKINETIC ER) 10-8 MG/5ML SUER  2. Pharmacy (Name, [place], [place]):[id number] [place], [place] - [address] 3. On Med List: yes  4. Last Visit with PCP: 05/12/19  5. Next visit date with PCP:   Agent: Please be advised that RX refills may take up to 3 business days. We ask that you follow-up with your pharmacy.

## 2019-06-08 ENCOUNTER — Ambulatory Visit (INDEPENDENT_AMBULATORY_CARE_PROVIDER_SITE_OTHER): Payer: BC Managed Care – PPO | Admitting: Internal Medicine

## 2019-06-08 ENCOUNTER — Other Ambulatory Visit: Payer: Self-pay

## 2019-06-08 ENCOUNTER — Encounter: Payer: Self-pay | Admitting: Internal Medicine

## 2019-06-08 VITALS — BP 156/88 | HR 77 | Temp 98.4°F | Resp 16 | Ht 71.0 in | Wt 249.0 lb

## 2019-06-08 DIAGNOSIS — E876 Hypokalemia: Secondary | ICD-10-CM | POA: Diagnosis not present

## 2019-06-08 DIAGNOSIS — T502X5A Adverse effect of carbonic-anhydrase inhibitors, benzothiadiazides and other diuretics, initial encounter: Secondary | ICD-10-CM

## 2019-06-08 DIAGNOSIS — I10 Essential (primary) hypertension: Secondary | ICD-10-CM | POA: Diagnosis not present

## 2019-06-08 DIAGNOSIS — J4540 Moderate persistent asthma, uncomplicated: Secondary | ICD-10-CM | POA: Diagnosis not present

## 2019-06-08 LAB — MAGNESIUM: Magnesium: 1.9 mg/dL (ref 1.5–2.5)

## 2019-06-08 LAB — BASIC METABOLIC PANEL
BUN: 15 mg/dL (ref 6–23)
CO2: 32 mEq/L (ref 19–32)
Calcium: 9.7 mg/dL (ref 8.4–10.5)
Chloride: 100 mEq/L (ref 96–112)
Creatinine, Ser: 1.1 mg/dL (ref 0.40–1.50)
GFR: 82.76 mL/min (ref >60.00)
Glucose, Bld: 113 mg/dL — ABNORMAL HIGH (ref 70–99)
Potassium: 3.3 mEq/L — ABNORMAL LOW (ref 3.5–5.1)
Sodium: 139 mEq/L (ref 135–145)

## 2019-06-08 NOTE — Progress Notes (Signed)
Subjective:  Patient ID: Brad Savannah., male    DOB: Apr 01, 1960  Age: 59 y.o. MRN: [id number]  CC: Cough and Hypertension  This visit occurred during the SARS-CoV-2 public health emergency.  Safety protocols were in place, including screening questions prior to the visit, additional usage of staff PPE, and extensive cleaning of exam room while observing appropriate contact time as indicated for disinfecting solutions.    HPI Brad Hayes. presents for f/up - He came in today for the sole purpose of asking for refill of Tussionex suspension.  He complains of persistent cough.  He did not directly answer the question as to whether or not he is using the Trelegy inhaler or taking his antihypertensives.  He says the cough is nonproductive and he denies any recent episodes of chest pain, shortness of breath, wheezing, dizziness, lightheadedness, diaphoresis, edema, or fatigue.  Outpatient Medications Prior to Visit  Medication Sig Dispense Refill  . Fluticasone-Umeclidin-Vilant (TRELEGY ELLIPTA) 100-62.5-25 MCG/INH AEPB Inhale 1 puff into the lungs daily. 90 each 1  . indapamide (LOZOL) 2.5 MG tablet Take 1 tablet (2.5 mg total) by mouth daily. 90 tablet 0  . montelukast (SINGULAIR) 10 MG tablet Take 1 tablet (10 mg total) by mouth at bedtime. 90 tablet 1  . nebivolol (BYSTOLIC) 10 MG tablet Take 1 tablet (10 mg total) by mouth daily. 90 tablet 0  . pioglitazone (ACTOS) 15 MG tablet Take 1 tablet (15 mg total) by mouth daily. 90 tablet 1  . potassium chloride SA (KLOR-CON) 20 MEQ tablet Take 1 tablet (20 mEq total) by mouth 2 (two) times daily. 180 tablet 0  . sildenafil (VIAGRA) 100 MG tablet Take 1 tablet (100 mg total) by mouth daily as needed for erectile dysfunction. 10 tablet 0  . chlorpheniramine-HYDROcodone (TUSSIONEX PENNKINETIC ER) 10-8 MG/5ML SUER Take 5 mLs by mouth every 12 (twelve) hours as needed for cough. 70 mL 0  . acetaminophen (TYLENOL) 500 MG tablet Take 500-1,000 mg  by mouth every 6 (six) hours as needed.     No facility-administered medications prior to visit.    ROS Review of Systems  Constitutional: Negative for chills, diaphoresis, fatigue and fever.  HENT: Negative.   Eyes: Negative.   Respiratory: Positive for cough. Negative for shortness of breath and wheezing.   Cardiovascular: Negative for chest pain, palpitations and leg swelling.  Gastrointestinal: Negative for abdominal pain, constipation, diarrhea, nausea and vomiting.  Endocrine: Negative.   Genitourinary: Negative.  Negative for difficulty urinating.  Musculoskeletal: Negative.  Negative for arthralgias and myalgias.  Skin: Negative.  Negative for color change, pallor and rash.  Neurological: Negative.  Negative for weakness.  Hematological: Negative for adenopathy. Does not bruise/bleed easily.  Psychiatric/Behavioral: Negative.     Objective:  BP (!) 156/88 (BP Location: Left Arm, Patient Position: Sitting, Cuff Size: Large)   Pulse 77   Temp 98.4 F (36.9 C) (Oral)   Resp 16   Ht 5' 11"  (1.803 m)   Wt 249 lb (112.9 kg)   SpO2 95%   BMI 34.73 kg/m   BP Readings from Last 3 Encounters:  06/08/19 (!) 156/88  05/12/19 (!) 164/96  03/02/19 132/80    Wt Readings from Last 3 Encounters:  06/08/19 249 lb (112.9 kg)  05/12/19 244 lb 8 oz (110.9 kg)  03/02/19 249 lb (112.9 kg)    Physical Exam Vitals reviewed.  Constitutional:      Appearance: Normal appearance.  HENT:     Nose: Nose normal.  Mouth/Throat:     Mouth: Mucous membranes are moist.  Eyes:     General: No scleral icterus.    Conjunctiva/sclera: Conjunctivae normal.  Cardiovascular:     Rate and Rhythm: Normal rate and regular rhythm.     Heart sounds: No murmur.  Pulmonary:     Effort: No tachypnea, accessory muscle usage or respiratory distress.     Breath sounds: Examination of the right-middle field reveals rhonchi. Examination of the left-middle field reveals rhonchi. Examination of the  right-lower field reveals rhonchi. Examination of the left-lower field reveals rhonchi. Rhonchi present. No decreased breath sounds, wheezing or rales.     Comments: Forced expiratory rhonchi Abdominal:     General: Abdomen is flat.     Palpations: There is no mass.     Tenderness: There is no abdominal tenderness. There is no guarding.     Hernia: No hernia is present.  Musculoskeletal:        General: Normal range of motion.     Cervical back: Neck supple.     Right lower leg: No edema.     Left lower leg: No edema.  Lymphadenopathy:     Cervical: No cervical adenopathy.  Skin:    General: Skin is warm and dry.  Neurological:     General: No focal deficit present.     Mental Status: He is alert.  Psychiatric:        Mood and Affect: Mood normal.        Behavior: Behavior normal.     Lab Results  Component Value Date   WBC 6.9 05/12/2019   HGB 13.1 05/12/2019   HCT 39.3 05/12/2019   PLT 247.0 05/12/2019   GLUCOSE 113 (H) 06/08/2019   CHOL 124 04/27/2018   TRIG 104.0 04/27/2018   HDL 38.60 (L) 04/27/2018   LDLCALC 64 04/27/2018   ALT 35 05/12/2019   AST 41 (H) 05/12/2019   NA 139 06/08/2019   K 3.3 (L) 06/08/2019   CL 100 06/08/2019   CREATININE 1.10 06/08/2019   BUN 15 06/08/2019   CO2 32 06/08/2019   TSH 1.47 04/27/2018   PSA 0.69 04/27/2018   HGBA1C 4.9 07/04/2017    No results found.  Assessment & Plan:   Paige was seen today for cough and hypertension.  Diagnoses and all orders for this visit:  Essential hypertension, benign- His blood pressure is not adequately well controlled.  I have asked him to be more compliant with the antihypertensives. -     Magnesium; Future -     Basic metabolic panel; Future -     Basic metabolic panel -     Magnesium  Diuretic-induced hypokalemia- His potassium level is low.  I have asked him to be more compliant with the potassium supplement. -     Magnesium; Future -     Basic metabolic panel; Future -     Basic  metabolic panel -     Magnesium  Moderate persistent asthma without complication- I declined his request for refill of Tussionex.  I have asked him to be compliant with the Trelegy inhaler.   I have discontinued Brad Katayama Jr.'s acetaminophen and chlorpheniramine-HYDROcodone. I am also having him maintain his sildenafil, indapamide, montelukast, nebivolol, Trelegy Ellipta, potassium chloride SA, and pioglitazone.  No orders of the defined types were placed in this encounter.    Follow-up: Return in about 2 months (around 08/08/2019).  Scarlette Calico, MD

## 2019-06-08 NOTE — Patient Instructions (Signed)

## 2019-06-09 ENCOUNTER — Encounter: Payer: Self-pay | Admitting: Internal Medicine

## 2019-06-09 ENCOUNTER — Telehealth: Payer: Self-pay | Admitting: Internal Medicine

## 2019-06-09 NOTE — Telephone Encounter (Signed)
    Patient requesting call regarding cough syrup

## 2019-06-09 NOTE — Telephone Encounter (Signed)
Pt called back and pt was informed that PCP will not rx the Hydrocodone cough syrup. PCP stated that is was no longer appropriate and that pt is not compliant with his other medications.   Pt stated that he will see another doctor to get a prescription for his cough syrup and will not be calling our office any more.

## 2019-06-09 NOTE — Telephone Encounter (Signed)
New message:   Pt is calling and states a cough syrup was supposed to be called in for him yesterday after his appt. Please advise.  Pharmacy is  [id number]- [place], [place] - [address]

## 2019-06-22 ENCOUNTER — Ambulatory Visit: Payer: BC Managed Care – PPO | Attending: Family

## 2019-06-22 DIAGNOSIS — Z23 Encounter for immunization: Secondary | ICD-10-CM

## 2019-06-22 NOTE — Progress Notes (Signed)
   [id number]  Name:  Tayden Nichelson.    MRN: [id number] DOB: Feb 21, 1961  06/22/2019  Mr. Ekstein was observed post Covid-19 immunization for 15 minutes without incident. He was provided with Vaccine Information Sheet and instruction to access the V-Safe system.   Mr. Mooneyhan was instructed to call 911 with any severe reactions post vaccine: Marland Kitchen Difficulty breathing  . Swelling of face and throat  . A fast heartbeat  . A bad rash all over body  . Dizziness and weakness   Immunizations Administered    Name Date Dose VIS Date Route   Moderna COVID-19 Vaccine 06/22/2019  2:47 PM 0.5 mL 01/2019 Intramuscular   Manufacturer: Moderna   Lot: [id number]   [place]: [id number]

## 2019-09-15 ENCOUNTER — Other Ambulatory Visit: Payer: Self-pay | Admitting: Internal Medicine

## 2019-09-15 DIAGNOSIS — I10 Essential (primary) hypertension: Secondary | ICD-10-CM

## 2019-11-23 ENCOUNTER — Ambulatory Visit (INDEPENDENT_AMBULATORY_CARE_PROVIDER_SITE_OTHER): Payer: BC Managed Care – PPO | Admitting: Internal Medicine

## 2019-11-23 ENCOUNTER — Other Ambulatory Visit: Payer: Self-pay

## 2019-11-23 ENCOUNTER — Encounter: Payer: Self-pay | Admitting: Internal Medicine

## 2019-11-23 DIAGNOSIS — I10 Essential (primary) hypertension: Secondary | ICD-10-CM

## 2019-11-23 DIAGNOSIS — L03114 Cellulitis of left upper limb: Secondary | ICD-10-CM | POA: Diagnosis not present

## 2019-11-23 DIAGNOSIS — R739 Hyperglycemia, unspecified: Secondary | ICD-10-CM

## 2019-11-23 MED ORDER — SULFAMETHOXAZOLE-TRIMETHOPRIM 800-160 MG PO TABS: 1.0000 | ORAL_TABLET | Freq: Two times a day (BID) | ORAL | 0 refills | Status: DC

## 2019-11-23 MED ORDER — CEFTRIAXONE SODIUM 1 G IJ SOLR
1.0000 g | Freq: Once | INTRAMUSCULAR | Status: AC
  Administered 2019-11-23: 1 g via INTRAMUSCULAR

## 2019-11-23 NOTE — Patient Instructions (Signed)
You had the antibiotic shot today (rocephin)  Please take all new medication as prescribed - the antibiotic  You are given the work note  Please go to the ED in 1-2 days if your red, swelling, pain or high fevers and chills become worse  Please continue all other medications as before, and refills have been done if requested.  Please have the pharmacy call with any other refills you may need.  Please keep your appointments with your specialists as you may have planned

## 2019-11-23 NOTE — Progress Notes (Signed)
Subjective:    Patient ID: Brad Hayes., male    DOB: May 05, 1960, 59 y.o.   MRN: [id number]  HPI  Here with c/o 2-3 days onset left upper arm medial pain, red, swelling without fever, abscess or drainage, but seems to be getting rapidly worse since last nght, starting to feel fatigued and bad.  Started after insect bite a few days ago.  Works as Sports coach for [employer].  Pt denies chest pain, increased sob or doe, wheezing, orthopnea, PND, increased LE swelling, palpitations, dizziness or syncope.   Pt denies polydipsia, polyuria Past Medical History:  Diagnosis Date  . Chronic insomnia   . Hypertension   . OSA (obstructive sleep apnea)    Past Surgical History:  Procedure Laterality Date  . dental extraction    . INGUINAL HERNIA REPAIR N/A 03/02/2019   Procedure: LAPAROSCOPIC RIGHT INGUINAL HERNIA REPAIR;  Surgeon: Jesusita Oka, MD;  Location: [facility];  Service: General;  Laterality: N/A;  . UMBILICAL HERNIA REPAIR N/A 03/02/2019   Procedure: UMBILICAL HERNIA REPAIR WITH MESH;  Surgeon: Jesusita Oka, MD;  Location: [facility];  Service: General;  Laterality: N/A;    reports that he has never smoked. He has never used smokeless tobacco. He reports current alcohol use of about 28.0 standard drinks of alcohol per week. He reports that he does not use drugs. family history includes Alcohol abuse in his father; Hypertension in his brother, mother, and sister; Sickle cell anemia in his mother. No Known Allergies Current Outpatient Medications on File Prior to Visit  Medication Sig Dispense Refill  . BYSTOLIC 10 MG tablet TAKE 1 TABLET BY MOUTH EVERY DAY 90 tablet 0  . Fluticasone-Umeclidin-Vilant (TRELEGY ELLIPTA) 100-62.5-25 MCG/INH AEPB Inhale 1 puff into the lungs daily. 90 each 1  . indapamide (LOZOL) 2.5 MG tablet TAKE 1 TABLET BY MOUTH EVERY DAY 90 tablet 0  . montelukast (SINGULAIR) 10 MG tablet Take 1 tablet (10 mg total) by mouth at bedtime. 90 tablet 1  . pioglitazone (ACTOS)  15 MG tablet Take 1 tablet (15 mg total) by mouth daily. 90 tablet 1  . potassium chloride SA (KLOR-CON) 20 MEQ tablet Take 1 tablet (20 mEq total) by mouth 2 (two) times daily. 180 tablet 0  . sildenafil (VIAGRA) 100 MG tablet Take 1 tablet (100 mg total) by mouth daily as needed for erectile dysfunction. 10 tablet 0   No current facility-administered medications on file prior to visit.   Review of Systems All otherwise neg per pt    Objective:   Physical Exam BP 140/80 (BP Location: Left Arm, Patient Position: Sitting, Cuff Size: Large)   Pulse 69   Temp 98 F (36.7 C) (Oral)   Ht 5' 11"  (1.803 m)   Wt 234 lb (106.1 kg)   SpO2 95%   BMI 32.64 kg/m   VS noted,  Constitutional: Pt appears in NAD HENT: Head: NCAT.  Right Ear: External ear normal.  Left Ear: External ear normal.  Eyes: . Pupils are equal, round, and reactive to light. Conjunctivae and EOM are normal Nose: without d/c or deformity Neck: Neck supple. Gross normal ROM Cardiovascular: Normal rate and regular rhythm.   Pulmonary/Chest: Effort normal and breath sounds without rales or wheezing.  Neurological: Pt is alert. At baseline orientation, motor grossly intact Skin: Skin is warm with large area approx 6 x 3 cm red, tender swelling without axillary LA or red streaks, or drainage, no LE edema Psychiatric: Pt behavior is normal  without agitation  All otherwise neg per pt Lab Results  Component Value Date   WBC 6.9 05/12/2019   HGB 13.1 05/12/2019   HCT 39.3 05/12/2019   PLT 247.0 05/12/2019   GLUCOSE 113 (H) 06/08/2019   CHOL 124 04/27/2018   TRIG 104.0 04/27/2018   HDL 38.60 (L) 04/27/2018   LDLCALC 64 04/27/2018   ALT 35 05/12/2019   AST 41 (H) 05/12/2019   NA 139 06/08/2019   K 3.3 (L) 06/08/2019   CL 100 06/08/2019   CREATININE 1.10 06/08/2019   BUN 15 06/08/2019   CO2 32 06/08/2019   TSH 1.47 04/27/2018   PSA 0.69 04/27/2018   HGBA1C 4.9 07/04/2017      Assessment & Plan:

## 2019-11-23 NOTE — Assessment & Plan Note (Addendum)
3 days worsening but without fever chills, for rocephin 1 gm IM now, and septra ds course, and instructed on natural hx, and to go to ED in 1-2 days if worsening  I spent 21 minutes in preparing to see the patient by review of recent labs, imaging and procedures, obtaining and reviewing separately obtained history, communicating with the patient and family or caregiver, ordering medications, tests or procedures, and documenting clinical information in the EHR including the differential Dx, treatment, and any further evaluation and other management of left arm cellulitis, htn, hyperglycemia

## 2019-11-28 ENCOUNTER — Encounter: Payer: Self-pay | Admitting: Internal Medicine

## 2019-11-28 NOTE — Assessment & Plan Note (Signed)
stable overall by history and exam, recent data reviewed with pt, and pt to continue medical treatment as before,  to f/u any worsening symptoms or concerns  

## 2020-01-19 ENCOUNTER — Other Ambulatory Visit: Payer: Self-pay | Admitting: Internal Medicine

## 2020-01-19 DIAGNOSIS — I10 Essential (primary) hypertension: Secondary | ICD-10-CM

## 2020-01-25 ENCOUNTER — Encounter: Payer: Self-pay | Admitting: Internal Medicine

## 2020-01-25 ENCOUNTER — Other Ambulatory Visit: Payer: Self-pay

## 2020-01-25 ENCOUNTER — Ambulatory Visit (INDEPENDENT_AMBULATORY_CARE_PROVIDER_SITE_OTHER): Payer: BC Managed Care – PPO | Admitting: Internal Medicine

## 2020-01-25 VITALS — BP 184/108 | HR 76 | Temp 98.2°F | Ht 71.0 in | Wt 239.0 lb

## 2020-01-25 DIAGNOSIS — T502X5A Adverse effect of carbonic-anhydrase inhibitors, benzothiadiazides and other diuretics, initial encounter: Secondary | ICD-10-CM | POA: Diagnosis not present

## 2020-01-25 DIAGNOSIS — R059 Cough, unspecified: Secondary | ICD-10-CM | POA: Diagnosis not present

## 2020-01-25 DIAGNOSIS — Z125 Encounter for screening for malignant neoplasm of prostate: Secondary | ICD-10-CM

## 2020-01-25 DIAGNOSIS — E559 Vitamin D deficiency, unspecified: Secondary | ICD-10-CM

## 2020-01-25 DIAGNOSIS — I10 Essential (primary) hypertension: Secondary | ICD-10-CM

## 2020-01-25 DIAGNOSIS — E876 Hypokalemia: Secondary | ICD-10-CM

## 2020-01-25 DIAGNOSIS — Z0001 Encounter for general adult medical examination with abnormal findings: Secondary | ICD-10-CM | POA: Diagnosis not present

## 2020-01-25 DIAGNOSIS — Z23 Encounter for immunization: Secondary | ICD-10-CM

## 2020-01-25 DIAGNOSIS — G4733 Obstructive sleep apnea (adult) (pediatric): Secondary | ICD-10-CM | POA: Insufficient documentation

## 2020-01-25 DIAGNOSIS — K7581 Nonalcoholic steatohepatitis (NASH): Secondary | ICD-10-CM | POA: Diagnosis not present

## 2020-01-25 DIAGNOSIS — E785 Hyperlipidemia, unspecified: Secondary | ICD-10-CM | POA: Insufficient documentation

## 2020-01-25 LAB — CBC WITH DIFFERENTIAL/PLATELET
Basophils Absolute: 0 10*3/uL (ref 0.0–0.1)
Basophils Relative: 0.6 % (ref 0.0–3.0)
Eosinophils Absolute: 0.1 10*3/uL (ref 0.0–0.7)
Eosinophils Relative: 1.4 % (ref 0.0–5.0)
HCT: 39.4 % (ref 39.0–52.0)
Hemoglobin: 13.3 g/dL (ref 13.0–17.0)
Lymphocytes Relative: 34.9 % (ref 12.0–46.0)
Lymphs Abs: 2.6 10*3/uL (ref 0.7–4.0)
MCHC: 33.8 g/dL (ref 30.0–36.0)
MCV: 89.6 fl (ref 78.0–100.0)
Monocytes Absolute: 0.7 10*3/uL (ref 0.1–1.0)
Monocytes Relative: 9.6 % (ref 3.0–12.0)
Neutro Abs: 4 10*3/uL (ref 1.4–7.7)
Neutrophils Relative %: 53.5 % (ref 43.0–77.0)
Platelets: 215 10*3/uL (ref 150.0–400.0)
RBC: 4.4 Mil/uL (ref 4.22–5.81)
RDW: 12.5 % (ref 11.5–15.5)
WBC: 7.4 10*3/uL (ref 4.0–10.5)

## 2020-01-25 LAB — LIPID PANEL
Cholesterol: 112 mg/dL (ref 0–200)
HDL: 48.4 mg/dL (ref >39.00)
LDL Cholesterol: 29 mg/dL (ref 0–99)
NonHDL: 63.94
Total CHOL/HDL Ratio: 2
Triglycerides: 176 mg/dL — ABNORMAL HIGH (ref 0.0–149.0)
VLDL: 35.2 mg/dL (ref 0.0–40.0)

## 2020-01-25 LAB — BASIC METABOLIC PANEL
BUN: 18 mg/dL (ref 6–23)
CO2: 30 mEq/L (ref 19–32)
Calcium: 9.5 mg/dL (ref 8.4–10.5)
Chloride: 98 mEq/L (ref 96–112)
Creatinine, Ser: 1.15 mg/dL (ref 0.40–1.50)
GFR: 69.36 mL/min (ref >60.00)
Glucose, Bld: 110 mg/dL — ABNORMAL HIGH (ref 70–99)
Potassium: 4.2 mEq/L (ref 3.5–5.1)
Sodium: 136 mEq/L (ref 135–145)

## 2020-01-25 LAB — URINALYSIS, ROUTINE W REFLEX MICROSCOPIC
Bilirubin Urine: NEGATIVE
Hgb urine dipstick: NEGATIVE
Ketones, ur: NEGATIVE
Leukocytes,Ua: NEGATIVE
Nitrite: NEGATIVE
RBC / HPF: NONE SEEN (ref 0–?)
Specific Gravity, Urine: >=1.03 — AB (ref 1.000–1.030)
Total Protein, Urine: NEGATIVE
Urine Glucose: NEGATIVE
Urobilinogen, UA: 0.2 (ref 0.0–1.0)
pH: 6 (ref 5.0–8.0)

## 2020-01-25 LAB — HEPATIC FUNCTION PANEL
ALT: 32 U/L (ref 0–53)
AST: 31 U/L (ref 0–37)
Albumin: 4.5 g/dL (ref 3.5–5.2)
Alkaline Phosphatase: 53 U/L (ref 39–117)
Bilirubin, Direct: 0.2 mg/dL (ref 0.0–0.3)
Total Bilirubin: 1 mg/dL (ref 0.2–1.2)
Total Protein: 7.4 g/dL (ref 6.0–8.3)

## 2020-01-25 LAB — TSH: TSH: 1.66 u[IU]/mL (ref 0.35–4.50)

## 2020-01-25 LAB — PROTIME-INR
INR: 1.1 ratio — ABNORMAL HIGH (ref 0.8–1.0)
Prothrombin Time: 11.9 s (ref 9.6–13.1)

## 2020-01-25 LAB — MAGNESIUM: Magnesium: 2 mg/dL (ref 1.5–2.5)

## 2020-01-25 LAB — VITAMIN D 25 HYDROXY (VIT D DEFICIENCY, FRACTURES): VITD: 14.62 ng/mL — ABNORMAL LOW (ref 30.00–100.00)

## 2020-01-25 LAB — PSA: PSA: 0.82 ng/mL (ref 0.10–4.00)

## 2020-01-25 MED ORDER — CHOLECALCIFEROL 1.25 MG (50000 UT) PO CAPS: 50000.0000 [IU] | ORAL_CAPSULE | ORAL | 1 refills | Status: DC

## 2020-01-25 MED ORDER — DEXTROMETHORPHAN POLISTIREX ER 30 MG/5ML PO SUER: 30.0000 mg | Freq: Two times a day (BID) | ORAL | 2 refills | Status: DC

## 2020-01-25 MED ORDER — TRIAMTERENE-HCTZ 37.5-25 MG PO TABS: 1.0000 | ORAL_TABLET | Freq: Every day | ORAL | 0 refills | Status: DC

## 2020-01-25 MED ORDER — ZYPITAMAG 1 MG PO TABS: 1.0000 | ORAL_TABLET | Freq: Every day | ORAL | 1 refills | Status: DC

## 2020-01-25 NOTE — Patient Instructions (Signed)

## 2020-01-25 NOTE — Progress Notes (Signed)
Subjective:  Patient ID: Brad Hayes., male    DOB: 1960/10/15  Age: 59 y.o. MRN: [id number]  CC: Annual Exam, Hypertension, and Cough  This visit occurred during the SARS-CoV-2 public health emergency.  Safety protocols were in place, including screening questions prior to the visit, additional usage of staff PPE, and extensive cleaning of exam room while observing appropriate contact time as indicated for disinfecting solutions.    HPI Brok Hayes. presents for a CPX.  He continues to complain of chronic cough and intermittent wheezing.  I previously prescribed a narcotic cough suppressant but I was concerned that there was some abuse so I have not been prescribing it recently.  He tells me he uses the Trelegy inhaler intermittently and gets some symptom relief.  He also tells me he is taking montelukast.  He denies chest pain, hemoptysis, fever, chills, or night sweats.  He also has a history of sleep apnea.    He has not been monitoring his blood pressure but tells me he is compliant with Bystolic and indapamide.  He has a history of hypokalemia but is not currently taking a potassium supplement.  He is active and denies any recent episodes of DOE, diaphoresis, dizziness, lightheadedness, palpitations, or edema.  He is taking pioglitazone for NASH.  He tells me he is tolerating it well.  He does not do much work on his lifestyle modifications and is gained weight.  He denies abdominal pain or icterus.  Outpatient Medications Prior to Visit  Medication Sig Dispense Refill  . Fluticasone-Umeclidin-Vilant (TRELEGY ELLIPTA) 100-62.5-25 MCG/INH AEPB Inhale 1 puff into the lungs daily. 90 each 1  . montelukast (SINGULAIR) 10 MG tablet Take 1 tablet (10 mg total) by mouth at bedtime. 90 tablet 1  . sildenafil (VIAGRA) 100 MG tablet Take 1 tablet (100 mg total) by mouth daily as needed for erectile dysfunction. 10 tablet 0  . BYSTOLIC 10 MG tablet TAKE 1 TABLET BY MOUTH EVERY DAY 30  tablet 0  . indapamide (LOZOL) 2.5 MG tablet TAKE 1 TABLET BY MOUTH EVERY DAY 30 tablet 0  . pioglitazone (ACTOS) 15 MG tablet Take 1 tablet (15 mg total) by mouth daily. 90 tablet 1  . potassium chloride SA (KLOR-CON) 20 MEQ tablet Take 1 tablet (20 mEq total) by mouth 2 (two) times daily. 180 tablet 0  . sulfamethoxazole-trimethoprim (BACTRIM DS) 800-160 MG tablet Take 1 tablet by mouth 2 (two) times daily. 20 tablet 0   No facility-administered medications prior to visit.    ROS Review of Systems  Constitutional: Positive for unexpected weight change (wt gain). Negative for appetite change, chills, diaphoresis, fatigue and fever.  HENT: Negative.  Negative for sinus pressure, sore throat and trouble swallowing.   Eyes: Negative.   Respiratory: Positive for apnea and cough. Negative for choking, chest tightness, shortness of breath and wheezing.   Cardiovascular: Negative for chest pain, palpitations and leg swelling.  Gastrointestinal: Negative for abdominal pain, constipation, diarrhea, nausea and vomiting.  Endocrine: Negative.   Genitourinary: Negative.  Negative for difficulty urinating, dysuria, scrotal swelling and urgency.  Musculoskeletal: Negative.  Negative for arthralgias and myalgias.  Skin: Negative for color change and pallor.  Neurological: Negative.  Negative for dizziness, weakness, light-headedness, numbness and headaches.  Hematological: Negative for adenopathy. Does not bruise/bleed easily.  Psychiatric/Behavioral: Negative.     Objective:  BP (!) 184/108   Pulse 76   Temp 98.2 F (36.8 C) (Oral)   Ht 5' 11"  (1.803 m)  Wt 239 lb (108.4 kg)   SpO2 95%   BMI 33.33 kg/m   BP Readings from Last 3 Encounters:  01/25/20 (!) 184/108  11/23/19 140/80  06/08/19 (!) 156/88    Wt Readings from Last 3 Encounters:  01/25/20 239 lb (108.4 kg)  11/23/19 234 lb (106.1 kg)  06/08/19 249 lb (112.9 kg)    Physical Exam Vitals reviewed.  Constitutional:       Appearance: Normal appearance.  HENT:     Nose: Nose normal.     Mouth/Throat:     Mouth: Mucous membranes are moist.  Eyes:     General: No scleral icterus.    Conjunctiva/sclera: Conjunctivae normal.  Cardiovascular:     Rate and Rhythm: Normal rate and regular rhythm.     Pulses: Normal pulses.     Heart sounds: No murmur heard.  No friction rub. No gallop.      Comments: EKG- NSR, 65 bpm No LVH Normal EKG Pulmonary:     Effort: Pulmonary effort is normal.     Breath sounds: No stridor. No wheezing, rhonchi or rales.  Abdominal:     General: Abdomen is flat.     Palpations: There is no mass.     Tenderness: There is no abdominal tenderness. There is no guarding or rebound.     Hernia: No hernia is present.  Genitourinary:    Comments: He was not willing to undress for a GU/rectal exam. Musculoskeletal:        General: Normal range of motion.     Cervical back: Neck supple.     Right lower leg: No edema.     Left lower leg: No edema.  Lymphadenopathy:     Cervical: No cervical adenopathy.  Skin:    General: Skin is warm and dry.  Neurological:     General: No focal deficit present.     Mental Status: He is alert and oriented to person, place, and time. Mental status is at baseline.  Psychiatric:        Mood and Affect: Mood normal.        Behavior: Behavior normal.     Lab Results  Component Value Date   WBC 7.4 01/25/2020   HGB 13.3 01/25/2020   HCT 39.4 01/25/2020   PLT 215.0 01/25/2020   GLUCOSE 110 (H) 01/25/2020   CHOL 112 01/25/2020   TRIG 176.0 (H) 01/25/2020   HDL 48.40 01/25/2020   LDLCALC 29 01/25/2020   ALT 32 01/25/2020   AST 31 01/25/2020   NA 136 01/25/2020   K 4.2 01/25/2020   CL 98 01/25/2020   CREATININE 1.15 01/25/2020   BUN 18 01/25/2020   CO2 30 01/25/2020   TSH 1.66 01/25/2020   PSA 0.82 01/25/2020   INR 1.1 (H) 01/25/2020   HGBA1C 4.9 07/04/2017    No results found.  Assessment & Plan:   Quantel was seen today for annual  exam, hypertension and cough.  Diagnoses and all orders for this visit:  Encounter for general adult medical examination with abnormal findings- Exam completed, labs reviewed, vaccines reviewed - He refused vaccines against pneumonia and influenza but was willing to get vaccinated against hepatitis A and B, cancer screenings are up-to-date, patient education was given. -     Lipid panel; Future -     PSA; Future -     PSA -     Lipid panel  Essential hypertension, benign- His blood pressure is not adequately well controlled and he  has a history of hypokalemia.  His EKG is negative for LVH or ischemia.  I recommended that he transition from indapamide to the combination of triamterene/hydrochlorothiazide.  Will continue the current dose of nebivolol. -     CBC with Differential/Platelet; Future -     Basic metabolic panel; Future -     TSH; Future -     VITAMIN D 25 Hydroxy (Vit-D Deficiency, Fractures); Future -     Urinalysis, Routine w reflex microscopic; Future -     Magnesium; Future -     triamterene-hydrochlorothiazide (MAXZIDE-25) 37.5-25 MG tablet; Take 1 tablet by mouth daily. -     EKG 12-Lead -     Magnesium -     Urinalysis, Routine w reflex microscopic -     VITAMIN D 25 Hydroxy (Vit-D Deficiency, Fractures) -     TSH -     Basic metabolic panel -     CBC with Differential/Platelet -     nebivolol (BYSTOLIC) 10 MG tablet; Take 1 tablet (10 mg total) by mouth daily.  Diuretic-induced hypokalemia- See above. -     Basic metabolic panel; Future -     Magnesium; Future -     triamterene-hydrochlorothiazide (MAXZIDE-25) 37.5-25 MG tablet; Take 1 tablet by mouth daily. -     Magnesium -     Basic metabolic panel  NASH (nonalcoholic steatohepatitis)- His LFTs have improved.  I recommended that he continue to work on his lifestyle modifications and continue taking the current dose of pioglitazone. -     Hepatic function panel; Future -     Protime-INR; Future -      Protime-INR -     Hepatic function panel -     pioglitazone (ACTOS) 15 MG tablet; Take 1 tablet (15 mg total) by mouth daily.  Cough- I have asked that he have this evaluated by pulmonary. -     Ambulatory referral to Pulmonology -     dextromethorphan (DELSYM) 30 MG/5ML liquid; Take 5 mLs (30 mg total) by mouth 2 (two) times daily.  OSA (obstructive sleep apnea)- See above.  Hyperlipidemia LDL goal <70- His ASCVD risk score is mildly elevated at 13.1%.  I recommended that he take a statin for cardiovascular risk reduction. -     Pitavastatin Magnesium (ZYPITAMAG) 1 MG TABS; Take 1 tablet by mouth daily.  Vitamin D deficiency disease -     Cholecalciferol 1.25 MG (50000 UT) capsule; Take 1 capsule (50,000 Units total) by mouth once a week.  Other orders -     Heplisav-B (HepB-CPG) Vaccine -     Hepatitis A vaccine adult IM   I have discontinued Reid Arvizu Jr.'s potassium chloride SA, sulfamethoxazole-trimethoprim, and indapamide. I have also changed his Bystolic to nebivolol. Additionally, I am having him start on triamterene-hydrochlorothiazide, dextromethorphan, Zypitamag, and Cholecalciferol. Lastly, I am having him maintain his sildenafil, montelukast, Trelegy Ellipta, and pioglitazone.  Meds ordered this encounter  Medications  . triamterene-hydrochlorothiazide (MAXZIDE-25) 37.5-25 MG tablet    Sig: Take 1 tablet by mouth daily.    Dispense:  90 tablet    Refill:  0  . dextromethorphan (DELSYM) 30 MG/5ML liquid    Sig: Take 5 mLs (30 mg total) by mouth 2 (two) times daily.    Dispense:  148 mL    Refill:  2  . Pitavastatin Magnesium (ZYPITAMAG) 1 MG TABS    Sig: Take 1 tablet by mouth daily.    Dispense:  90 tablet  Refill:  1  . Cholecalciferol 1.25 MG (50000 UT) capsule    Sig: Take 1 capsule (50,000 Units total) by mouth once a week.    Dispense:  12 capsule    Refill:  1  . nebivolol (BYSTOLIC) 10 MG tablet    Sig: Take 1 tablet (10 mg total) by mouth daily.     Dispense:  90 tablet    Refill:  0  . pioglitazone (ACTOS) 15 MG tablet    Sig: Take 1 tablet (15 mg total) by mouth daily.    Dispense:  90 tablet    Refill:  1   In addition to time spent on CPE, I spent 50 minutes in preparing to see the patient by review of recent labs, imaging and procedures, obtaining and reviewing separately obtained history, communicating with the patient and family or caregiver, ordering medications, tests or procedures, and documenting clinical information in the EHR including the differential Dx, treatment, and any further evaluation and other management of 1. Essential hypertension, benign 2. Diuretic-induced hypokalemia 3. NASH (nonalcoholic steatohepatitis) 4. Cough 5. OSA (obstructive sleep apnea) 6. Hyperlipidemia LDL goal <70 7. Vitamin D deficiency disease     Follow-up: Return in about 6 weeks (around 03/07/2020).  Scarlette Calico, MD

## 2020-01-26 ENCOUNTER — Other Ambulatory Visit: Payer: Self-pay | Admitting: Internal Medicine

## 2020-01-26 DIAGNOSIS — I10 Essential (primary) hypertension: Secondary | ICD-10-CM

## 2020-01-26 MED ORDER — NEBIVOLOL HCL 10 MG PO TABS: 10.0000 mg | ORAL_TABLET | Freq: Every day | ORAL | 0 refills | Status: DC

## 2020-01-27 MED ORDER — PIOGLITAZONE HCL 15 MG PO TABS: 15.0000 mg | ORAL_TABLET | Freq: Every day | ORAL | 1 refills | Status: DC

## 2020-04-16 ENCOUNTER — Other Ambulatory Visit: Payer: Self-pay | Admitting: Internal Medicine

## 2020-04-16 DIAGNOSIS — E876 Hypokalemia: Secondary | ICD-10-CM

## 2020-04-16 DIAGNOSIS — I10 Essential (primary) hypertension: Secondary | ICD-10-CM

## 2020-04-27 ENCOUNTER — Ambulatory Visit (INDEPENDENT_AMBULATORY_CARE_PROVIDER_SITE_OTHER): Payer: BC Managed Care – PPO

## 2020-04-27 ENCOUNTER — Ambulatory Visit (INDEPENDENT_AMBULATORY_CARE_PROVIDER_SITE_OTHER): Payer: BC Managed Care – PPO | Admitting: Internal Medicine

## 2020-04-27 ENCOUNTER — Other Ambulatory Visit: Payer: Self-pay

## 2020-04-27 ENCOUNTER — Encounter: Payer: Self-pay | Admitting: Internal Medicine

## 2020-04-27 DIAGNOSIS — R058 Other specified cough: Secondary | ICD-10-CM

## 2020-04-27 MED ORDER — BENZONATATE 200 MG PO CAPS: 200.0000 mg | ORAL_CAPSULE | Freq: Three times a day (TID) | ORAL | 1 refills | Status: DC | PRN

## 2020-04-27 MED ORDER — PANTOPRAZOLE SODIUM 40 MG PO TBEC: 40.0000 mg | DELAYED_RELEASE_TABLET | Freq: Every day | ORAL | 2 refills | Status: DC

## 2020-04-27 MED ORDER — FAMOTIDINE 20 MG PO TABS: ORAL_TABLET | ORAL | 11 refills | Status: DC

## 2020-04-27 MED ORDER — PREDNISONE 10 MG PO TABS: ORAL_TABLET | ORAL | 0 refills | Status: DC

## 2020-04-27 NOTE — Assessment & Plan Note (Addendum)
Onset 04/2019 no response to Trelegy /singulair/clariton  - max rx for gerd/ tessalon and 1st gen H1 blockers per guidelines 04/27/2020 >>>   The most common causes of chronic cough in immunocompetent adults include the following: upper airway cough syndrome (UACS), previously referred to as postnasal drip syndrome (PNDS), which is caused by variety of rhinosinus conditions; (2) asthma; (3) GERD; (4) chronic bronchitis from cigarette smoking or other inhaled environmental irritants; (5) nonasthmatic eosinophilic bronchitis; and (6) bronchiectasis.   These conditions, singly or in combination, have accounted for up to 94% of the causes of chronic cough in prospective studies.   Other conditions have constituted no >6% of the causes in prospective studies These have included bronchogenic carcinoma, chronic interstitial pneumonia, sarcoidosis, left ventricular failure, ACEI-induced cough, and aspiration from a condition associated with pharyngeal dysfunction.    Chronic cough is often simultaneously caused by more than one condition. A single cause has been found from 38 to 82% of the time, multiple causes from 18 to 62%. Multiply caused cough has been the result of three diseases up to 42% of the time.       Of the three most common causes of  Sub-acute / recurrent or chronic cough, only one (GERD)  can actually contribute to/ trigger  the other two (asthma and post nasal drip syndrome)  and perpetuate the cylce of cough.  While not intuitively obvious, many patients with chronic low grade reflux do not cough until there is a primary insult that disturbs the protective epithelial barrier and exposes sensitive nerve endings.   This is typically viral but can due to PNDS and  either may apply here.    >>> The point is that once this occurs, it is difficult to eliminate the cycle  using anything but a maximally effective acid suppression regimen at least in the short run, accompanied by an appropriate diet  to address non acid GERD and control / eliminate the pnds with 1st gen H1 blockers per guidelines  And the cough with tessalon 200 >>> also so added 6 day taper off  Prednisone starting at 40 mg per day in case of component of Th-2 driven upper or lower airways inflammation (if cough responds short term only to relapse before return while will on full rx for uacs (as above), then  that would point to allergic rhinitis/ asthma or eos bronchitis as alternative dx)   >>>  gabapentin the next optiion to avoid refilling narcotic based cough syrups / reviewed with pt  Also advised:  The standardized cough guidelines published in Chest by Lissa Morales in 2006 are still the best available and consist of a multiple step process (up to 12!) , not a single office visit,  and are intended  to address this problem logically,  with an alogrithm dependent on response to empiric treatment at  each progressive step  to determine a specific diagnosis with  minimal addtional testing needed. Therefore if adherence is an issue or can't be accurately verified,  it's very unlikely the standard evaluation and treatment will be successful here.    Furthermore, response to therapy (other than acute cough suppression, which should only be used short term with avoidance of narcotic containing cough syrups if possible), can be a gradual process for which the patient is not likely to  perceive immediate benefit.  Unlike going to an eye doctor where the best perscription is almost always the first one and is immediately effective, this is almost never the  case in the management of chronic cough syndromes. Therefore the patient needs to commit up front to consistently adhere to recommendations  for up to 6 weeks of therapy directed at the likely underlying problem(s) before the response can be reasonably evaluated.   >>> advised return in 4 weeks to regroup          Each maintenance medication was reviewed in detail including  emphasizing most importantly the difference between maintenance and prns and under what circumstances the prns are to be triggered using an action plan format where appropriate.  Total time for H and P, chart review, counseling  and generating customized AVS unique to this office visit / same day charting = 55 min        .

## 2020-04-27 NOTE — Progress Notes (Signed)
Brad Savannah., male    DOB: Mar 21, 1960   MRN: [id number]   Brief patient profile:  55 yobm never smoker with "new cough" in 03/2019 (actually recorded in Dr Nathanial Millman note 04/22/19 as recurrent and "typically pcp calls in cough med that is the only thing that works" with 1st rx for tussionex  2016 in [facility])   referred to pulmonary clinic 04/27/2020 by Dr  Ronnald Ramp with FENO of 11 05/12/19 with c/o of cough that only responds to narcotic containing cough meds.      History of Present Illness  04/27/2020  Pulmonary/ 1st office eval/Taiyo Kozma on trelegy taking only occ  Chief Complaint  Patient presents with   Pulmonary Consult    Referred by Dr. Scarlette Calico. Pt c/o cough for the past year. Cough is non prod and tends to be worse at night and sometimes keeps him up.   Dyspnea:  No longer working out but Not limited by breathing from desired activities   Cough: dry, wakes up with cough once a week / at some point coughs during the day too but no pattern at all/no pets in bedroom /no triggers Sleep: as above sleeps prone  SABA use: none / on trelegy last dose x 2 weeks not helping   No obvious patterns in day to day or daytime variability or assoc excess/ purulent sputum or mucus plugs or hemoptysis or cp or chest tightness, subjective wheeze or overt sinus or hb symptoms.   Sleeping  without nocturnal  or early am exacerbation  of respiratory c/o's or need for noct saba. Also denies any obvious fluctuation of symptoms with weather or environmental changes or other aggravating or alleviating factors except as outlined above   No unusual exposure hx or h/o childhood pna/ asthma or knowledge of premature birth.  Current Allergies, Complete Past Medical History, Past Surgical History, Family History, and Social History were reviewed in [employer] record.  ROS  The following are not active complaints unless bolded Hoarseness, sore throat, dysphagia, dental problems, itching,  sneezing,  nasal congestion or discharge of excess mucus or purulent secretions, ear ache,   fever, chills, sweats, unintended wt loss or wt gain, classically pleuritic or exertional cp,  orthopnea pnd or arm/hand swelling  or leg swelling, presyncope, palpitations, abdominal pain, anorexia, nausea, vomiting, diarrhea  or change in bowel habits or change in bladder habits, change in stools or change in urine, dysuria, hematuria,  rash, arthralgias, visual complaints, headache, numbness, weakness or ataxia or problems with walking or coordination,  change in mood or  memory.            .  Past Medical History:  Diagnosis Date   Chronic insomnia    Hypertension    OSA (obstructive sleep apnea)     Outpatient Medications Prior to Visit -  - NOTE:   Unable to verify as accurately reflecting what pt takes     Medication Sig Dispense Refill   dextromethorphan (DELSYM) 30 MG/5ML liquid Take 5 mLs (30 mg total) by mouth 2 (two) times daily. 148 mL 2       1   nebivolol (BYSTOLIC) 10 MG tablet Take 1 tablet (10 mg total) by mouth daily. 90 tablet 0   sildenafil (VIAGRA) 100 MG tablet Take 1 tablet (100 mg total) by mouth daily as needed for erectile dysfunction. 10 tablet 0   triamterene-hydrochlorothiazide (MAXZIDE-25) 37.5-25 MG tablet TAKE 1 TABLET BY MOUTH EVERY DAY 90 tablet 0  Cholecalciferol 1.25 MG (50000 UT) capsule Take 1 capsule (50,000 Units total) by mouth once a week. 12 capsule 1   montelukast (SINGULAIR) 10 MG tablet Take 1 tablet (10 mg total) by mouth at bedtime. 90 tablet 1   pioglitazone (ACTOS) 15 MG tablet Take 1 tablet (15 mg total) by mouth daily. 90 tablet 1   Pitavastatin Magnesium (ZYPITAMAG) 1 MG TABS Take 1 tablet by mouth daily. 90 tablet 1   No facility-administered medications prior to visit.     Objective:     BP 132/82 (BP Location: Left Arm, Cuff Size: Normal)    Pulse 80    Temp (!) 97.3 F (36.3 C) (Temporal)    Ht 5' 11.5" (1.816 m)    Wt  239 lb 3.2 oz (108.5 kg)    SpO2 98% Comment: on RA   BMI 32.90 kg/m   SpO2: 98 % (on RA)   amb pleasant bm nad   HEENT : pt wearing mask not removed for exam due to covid -19 concerns.    NECK :  without JVD/Nodes/TM/ nl carotid upstrokes bilaterally   LUNGS: no acc muscle use,  Nl contour chest which is clear to A and P bilaterally without cough on insp or exp maneuvers   CV:  RRR  no s3 or murmur or increase in P2, and no edema   ABD:  soft and nontender with nl inspiratory excursion in the supine position. No bruits or organomegaly appreciated, bowel sounds nl  MS:  Nl gait/ ext warm without deformities, calf tenderness, cyanosis or clubbing No obvious joint restrictions   SKIN: warm and dry without lesions    NEURO:  alert, approp, nl sensorium with  no motor or cerebellar deficits apparent.      CXR PA and Lateral:   04/27/2020 :    I personally reviewed images and agree with radiology impression as follows:   djd T spine, minimal non -specific markings    Assessment   Upper airway cough syndrome Onset 04/2019 no response to Trelegy /singulair/clariton  - max rx for gerd/ tessalon and 1st gen H1 blockers per guidelines 04/27/2020 >>>   The most common causes of chronic cough in immunocompetent adults include the following: upper airway cough syndrome (UACS), previously referred to as postnasal drip syndrome (PNDS), which is caused by variety of rhinosinus conditions; (2) asthma; (3) GERD; (4) chronic bronchitis from cigarette smoking or other inhaled environmental irritants; (5) nonasthmatic eosinophilic bronchitis; and (6) bronchiectasis.   These conditions, singly or in combination, have accounted for up to 94% of the causes of chronic cough in prospective studies.   Other conditions have constituted no >6% of the causes in prospective studies These have included bronchogenic carcinoma, chronic interstitial pneumonia, sarcoidosis, left ventricular failure, ACEI-induced  cough, and aspiration from a condition associated with pharyngeal dysfunction.    Chronic cough is often simultaneously caused by more than one condition. A single cause has been found from 38 to 82% of the time, multiple causes from 18 to 62%. Multiply caused cough has been the result of three diseases up to 42% of the time.       Of the three most common causes of  Sub-acute / recurrent or chronic cough, only one (GERD)  can actually contribute to/ trigger  the other two (asthma and post nasal drip syndrome)  and perpetuate the cylce of cough.  While not intuitively obvious, many patients with chronic low grade reflux do not cough until there is  a primary insult that disturbs the protective epithelial barrier and exposes sensitive nerve endings.   This is typically viral but can due to PNDS and  either may apply here.    >>> The point is that once this occurs, it is difficult to eliminate the cycle  using anything but a maximally effective acid suppression regimen at least in the short run, accompanied by an appropriate diet to address non acid GERD and control / eliminate the pnds with 1st gen H1 blockers per guidelines  And the cough with tessalon 200 >>> also so added 6 day taper off  Prednisone starting at 40 mg per day in case of component of Th-2 driven upper or lower airways inflammation (if cough responds short term only to relapse before return while will on full rx for uacs (as above), then  that would point to allergic rhinitis/ asthma or eos bronchitis as alternative dx)   >>>  gabapentin the next optiion to avoid refilling narcotic based cough syrups / reviewed with pt  Also advised:  The standardized cough guidelines published in Chest by Lissa Morales in 2006 are still the best available and consist of a multiple step process (up to 12!) , not a single office visit,  and are intended  to address this problem logically,  with an alogrithm dependent on response to empiric treatment at   each progressive step  to determine a specific diagnosis with  minimal addtional testing needed. Therefore if adherence is an issue or can't be accurately verified,  it's very unlikely the standard evaluation and treatment will be successful here.    Furthermore, response to therapy (other than acute cough suppression, which should only be used short term with avoidance of narcotic containing cough syrups if possible), can be a gradual process for which the patient is not likely to  perceive immediate benefit.  Unlike going to an eye doctor where the best perscription is almost always the first one and is immediately effective, this is almost never the case in the management of chronic cough syndromes. Therefore the patient needs to commit up front to consistently adhere to recommendations  for up to 6 weeks of therapy directed at the likely underlying problem(s) before the response can be reasonably evaluated.   >>> advised return in 4 weeks to regroup          Each maintenance medication was reviewed in detail including emphasizing most importantly the difference between maintenance and prns and under what circumstances the prns are to be triggered using an action plan format where appropriate.  Total time for H and P, chart review, counseling  and generating customized AVS unique to this office visit / same day charting = 55 min        .     Christinia Gully, MD 04/27/2020

## 2020-04-27 NOTE — Patient Instructions (Addendum)
Pantoprazole (protonix) 40 mg   Take  30-60 min before first meal of the day and Pepcid (famotidine)  20 mg one after supper until return to office - this is the best way to tell whether stomach acid is contributing to your problem.    Prednisone 10 mg take  4 each am x 2 days,   2 each am x 2 days,  1 each am x 2 days and stop   GERD (REFLUX)  is an extremely common cause of respiratory symptoms just like yours , many times with no obvious heartburn at all.    It can be treated with medication, but also with lifestyle changes including elevation of the head of your bed (ideally with 6 -8inch blocks under the headboard of your bed),  Smoking cessation, avoidance of late meals, excessive alcohol, and avoid fatty foods, chocolate, peppermint, colas, red wine, and acidic juices such as orange juice.  NO MINT OR MENTHOL PRODUCTS SO NO COUGH DROPS  USE SUGARLESS CANDY INSTEAD (Jolley ranchers or Stover's or Life Savers) or even ice chips will also do - the key is to swallow to prevent all throat clearing. NO OIL BASED VITAMINS - use powdered substitutes.  Avoid fish oil when coughing.  For cough >  Tessalon 200 mg every 6 hours as needed   For drainage, tickle > For drainage / throat tickle try take (OTC) CHLORPHENIRAMINE  4 mg  (Chlortab 32m  at [employer] be easiest to find in the green box)  take one every 4 hours as needed - available over the counter- may cause drowsiness so start with just a bedtime dose or two and see how you tolerate it before trying in daytime      Please schedule a follow up office visit in 4 weeks, sooner if needed  - next step is gabapentin

## 2020-04-28 ENCOUNTER — Encounter: Payer: Self-pay | Admitting: Internal Medicine

## 2020-07-25 ENCOUNTER — Other Ambulatory Visit: Payer: Self-pay | Admitting: Internal Medicine

## 2020-08-12 ENCOUNTER — Other Ambulatory Visit: Payer: Self-pay | Admitting: Internal Medicine

## 2020-08-12 DIAGNOSIS — I10 Essential (primary) hypertension: Secondary | ICD-10-CM

## 2020-08-12 DIAGNOSIS — T502X5A Adverse effect of carbonic-anhydrase inhibitors, benzothiadiazides and other diuretics, initial encounter: Secondary | ICD-10-CM

## 2020-09-07 ENCOUNTER — Other Ambulatory Visit: Payer: Self-pay | Admitting: Internal Medicine

## 2020-09-07 DIAGNOSIS — I10 Essential (primary) hypertension: Secondary | ICD-10-CM

## 2020-09-07 DIAGNOSIS — E876 Hypokalemia: Secondary | ICD-10-CM

## 2020-10-18 ENCOUNTER — Other Ambulatory Visit: Payer: Self-pay | Admitting: Internal Medicine

## 2020-10-18 ENCOUNTER — Telehealth: Payer: Self-pay | Admitting: Internal Medicine

## 2020-10-18 DIAGNOSIS — I10 Essential (primary) hypertension: Secondary | ICD-10-CM

## 2020-10-18 MED ORDER — NEBIVOLOL HCL 10 MG PO TABS: 10.0000 mg | ORAL_TABLET | Freq: Every day | ORAL | 0 refills | Status: DC

## 2020-10-18 NOTE — Telephone Encounter (Signed)
1.Medication Requested: nebivolol (BYSTOLIC) 10 MG tablet 2. Pharmacy (Name, Street, [place]): [id number] [place], [place] [address]. On Med List: Y  4. Last Visit with PCP: 01/25/20  5. Next visit date with PCP: 10/24/20   Agent: Please be advised that RX refills may take up to 3 business days. We ask that you follow-up with your pharmacy..Marland Kitchen

## 2020-10-24 ENCOUNTER — Encounter: Payer: Self-pay | Admitting: Internal Medicine

## 2020-10-24 ENCOUNTER — Other Ambulatory Visit: Payer: Self-pay

## 2020-10-24 ENCOUNTER — Ambulatory Visit: Payer: BC Managed Care – PPO | Admitting: Internal Medicine

## 2020-10-24 VITALS — BP 166/100 | HR 61 | Temp 98.5°F | Ht 71.5 in | Wt 237.0 lb

## 2020-10-24 DIAGNOSIS — E876 Hypokalemia: Secondary | ICD-10-CM | POA: Diagnosis not present

## 2020-10-24 DIAGNOSIS — F528 Other sexual dysfunction not due to a substance or known physiological condition: Secondary | ICD-10-CM

## 2020-10-24 DIAGNOSIS — T502X5A Adverse effect of carbonic-anhydrase inhibitors, benzothiadiazides and other diuretics, initial encounter: Secondary | ICD-10-CM

## 2020-10-24 DIAGNOSIS — I10 Essential (primary) hypertension: Secondary | ICD-10-CM

## 2020-10-24 DIAGNOSIS — J4540 Moderate persistent asthma, uncomplicated: Secondary | ICD-10-CM

## 2020-10-24 DIAGNOSIS — R058 Other specified cough: Secondary | ICD-10-CM

## 2020-10-24 DIAGNOSIS — K7581 Nonalcoholic steatohepatitis (NASH): Secondary | ICD-10-CM

## 2020-10-24 MED ORDER — TRIAMTERENE-HCTZ 37.5-25 MG PO TABS: 1.0000 | ORAL_TABLET | Freq: Every day | ORAL | 0 refills | Status: DC

## 2020-10-24 MED ORDER — SILDENAFIL CITRATE 20 MG PO TABS: 80.0000 mg | ORAL_TABLET | Freq: Every day | ORAL | 3 refills | Status: DC | PRN

## 2020-10-24 MED ORDER — QVAR REDIHALER 40 MCG/ACT IN AERB: 1.0000 | INHALATION_SPRAY | Freq: Two times a day (BID) | RESPIRATORY_TRACT | 1 refills | Status: DC

## 2020-10-24 MED ORDER — HYDROCODONE BIT-HOMATROP MBR 5-1.5 MG/5ML PO SOLN
5.0000 mL | Freq: Three times a day (TID) | ORAL | 0 refills | Status: DC | PRN
Start: 2020-10-24 — End: 2021-01-22

## 2020-10-24 NOTE — Patient Instructions (Signed)

## 2020-10-24 NOTE — Progress Notes (Signed)
Subjective:  Patient ID: Brad Hayes., male    DOB: 11-27-1960  Age: 60 y.o. MRN: [id number]  CC: Asthma, Cough, and Hypertension  This visit occurred during the SARS-CoV-2 public health emergency.  Safety protocols were in place, including screening questions prior to the visit, additional usage of staff PPE, and extensive cleaning of exam room while observing appropriate contact time as indicated for disinfecting solutions.    HPI Brad Hayes. presents for f/up -  He ran out of the diuretic 2 weeks ago.  Since then his blood pressure has not been well controlled.  He complains of mild, intermittent headaches but denies blurred vision, chest pain, shortness of breath, or diaphoresis.  He complains of chronic cough related to asthma.  He is not getting much symptom relief with Tessalon [place].  Outpatient Medications Prior to Visit  Medication Sig Dispense Refill   benzonatate (TESSALON) 200 MG capsule Take 1 capsule (200 mg total) by mouth 3 (three) times daily as needed for cough. 45 capsule 1   famotidine (PEPCID) 20 MG tablet One after supper 30 tablet 11   nebivolol (BYSTOLIC) 10 MG tablet Take 1 tablet (10 mg total) by mouth daily. 90 tablet 0   pantoprazole (PROTONIX) 40 MG tablet TAKE 1 TABLET (40 MG TOTAL) BY MOUTH DAILY. TAKE 30-60 MIN BEFORE FIRST MEAL OF THE DAY 90 tablet 1   predniSONE (DELTASONE) 10 MG tablet Take  4 each am x 2 days,   2 each am x 2 days,  1 each am x 2 days and stop 14 tablet 0   sildenafil (VIAGRA) 100 MG tablet Take 1 tablet (100 mg total) by mouth daily as needed for erectile dysfunction. 10 tablet 0   triamterene-hydrochlorothiazide (MAXZIDE-25) 37.5-25 MG tablet TAKE 1 TABLET BY MOUTH EVERY DAY 90 tablet 0   No facility-administered medications prior to visit.    ROS Review of Systems  Constitutional:  Negative for appetite change, chills, diaphoresis, fatigue and unexpected weight change.  HENT: Negative.    Eyes:  Negative for visual  disturbance.  Respiratory:  Positive for cough. Negative for chest tightness, shortness of breath and wheezing.   Cardiovascular:  Negative for chest pain, palpitations and leg swelling.  Gastrointestinal:  Negative for abdominal pain, diarrhea, nausea and vomiting.  Genitourinary: Negative.  Negative for difficulty urinating and hematuria.       +ED  Musculoskeletal:  Negative for arthralgias, back pain and myalgias.  Skin: Negative.  Negative for color change.  Neurological:  Positive for headaches. Negative for dizziness, seizures, speech difficulty, weakness and light-headedness.  Hematological:  Negative for adenopathy. Does not bruise/bleed easily.  Psychiatric/Behavioral: Negative.     Objective:  BP (!) 166/100 (BP Location: Left Arm, Patient Position: Sitting, Cuff Size: Large)   Pulse 61   Temp 98.5 F (36.9 C) (Oral)   Ht 5' 11.5" (1.816 m)   Wt 237 lb (107.5 kg)   SpO2 99%   BMI 32.59 kg/m   BP Readings from Last 3 Encounters:  10/24/20 (!) 166/100  04/27/20 132/82  01/25/20 (!) 184/108    Wt Readings from Last 3 Encounters:  10/24/20 237 lb (107.5 kg)  04/27/20 239 lb 3.2 oz (108.5 kg)  01/25/20 239 lb (108.4 kg)    Physical Exam Vitals reviewed.  Constitutional:      Appearance: Normal appearance. He is not ill-appearing.  HENT:     Nose: Nose normal.     Mouth/Throat:     Mouth: Mucous membranes  are moist.  Eyes:     General: No scleral icterus.    Conjunctiva/sclera: Conjunctivae normal.  Cardiovascular:     Rate and Rhythm: Normal rate and regular rhythm.     Heart sounds: No murmur heard. Pulmonary:     Effort: Pulmonary effort is normal.     Breath sounds: No stridor. No wheezing, rhonchi or rales.  Abdominal:     General: Abdomen is flat.     Palpations: There is no mass.     Tenderness: There is no abdominal tenderness. There is no guarding.     Hernia: No hernia is present.  Musculoskeletal:        General: Normal range of motion.      Cervical back: Neck supple.     Right lower leg: No edema.     Left lower leg: No edema.  Lymphadenopathy:     Cervical: No cervical adenopathy.  Skin:    General: Skin is warm and dry.  Neurological:     General: No focal deficit present.     Mental Status: He is alert.  Psychiatric:        Mood and Affect: Mood normal.        Behavior: Behavior normal.    Lab Results  Component Value Date   WBC 6.0 10/24/2020   HGB 12.4 (L) 10/24/2020   HCT 37.3 (L) 10/24/2020   PLT 250.0 10/24/2020   GLUCOSE 86 10/24/2020   CHOL 112 01/25/2020   TRIG 176.0 (H) 01/25/2020   HDL 48.40 01/25/2020   LDLCALC 29 01/25/2020   ALT 32 10/24/2020   AST 25 10/24/2020   NA 140 10/24/2020   K 4.3 10/24/2020   CL 103 10/24/2020   CREATININE 1.29 10/24/2020   BUN 12 10/24/2020   CO2 30 10/24/2020   TSH 1.66 01/25/2020   PSA 0.82 01/25/2020   INR 1.1 (H) 01/25/2020   HGBA1C 4.9 07/04/2017    No results found.  Assessment & Plan:   Robbi was seen today for asthma, cough and hypertension.  Diagnoses and all orders for this visit:  Essential hypertension, benign- His blood pressure is not adequately well controlled and he is symptomatic.  Will restart triamterene and hydrochlorothiazide. -     triamterene-hydrochlorothiazide (MAXZIDE-25) 37.5-25 MG tablet; Take 1 tablet by mouth daily. -     CBC with Differential/Platelet; Future -     Basic metabolic panel; Future -     Basic metabolic panel -     CBC with Differential/Platelet  NASH (nonalcoholic steatohepatitis)- His liver enzymes are improved.  He is working on his lifestyle modifications. -     Hepatic function panel; Future -     Hepatic function panel  Diuretic-induced hypokalemia- His potassium level is normal. -     triamterene-hydrochlorothiazide (MAXZIDE-25) 37.5-25 MG tablet; Take 1 tablet by mouth daily. -     Basic metabolic panel; Future -     Basic metabolic panel  Psychosexual dysfunction with inhibited sexual  excitement -     sildenafil (REVATIO) 20 MG tablet; Take 4 tablets (80 mg total) by mouth daily as needed.  Moderate persistent asthma without complication- I recommended that he start using an ICS. -     beclomethasone (QVAR REDIHALER) 40 MCG/ACT inhaler; Inhale 1 puff into the lungs 2 (two) times daily.  Upper airway cough syndrome -     HYDROcodone bit-homatropine (HYCODAN) 5-1.5 MG/5ML syrup; Take 5 mLs by mouth every 8 (eight) hours as needed for cough.  I have discontinued London Spagna Jr.'s sildenafil and predniSONE. I have also changed his triamterene-hydrochlorothiazide. Additionally, I am having him start on sildenafil, Qvar RediHaler, and HYDROcodone bit-homatropine. Lastly, I am having him maintain his benzonatate, famotidine, pantoprazole, and nebivolol.  Meds ordered this encounter  Medications   triamterene-hydrochlorothiazide (MAXZIDE-25) 37.5-25 MG tablet    Sig: Take 1 tablet by mouth daily.    Dispense:  90 tablet    Refill:  0   sildenafil (REVATIO) 20 MG tablet    Sig: Take 4 tablets (80 mg total) by mouth daily as needed.    Dispense:  60 tablet    Refill:  3   beclomethasone (QVAR REDIHALER) 40 MCG/ACT inhaler    Sig: Inhale 1 puff into the lungs 2 (two) times daily.    Dispense:  31.8 g    Refill:  1   HYDROcodone bit-homatropine (HYCODAN) 5-1.5 MG/5ML syrup    Sig: Take 5 mLs by mouth every 8 (eight) hours as needed for cough.    Dispense:  120 mL    Refill:  0     Follow-up: Return in about 6 weeks (around 12/05/2020).  Scarlette Calico, MD

## 2020-10-25 LAB — HEPATIC FUNCTION PANEL
ALT: 32 U/L (ref 0–53)
AST: 25 U/L (ref 0–37)
Albumin: 4.3 g/dL (ref 3.5–5.2)
Alkaline Phosphatase: 49 U/L (ref 39–117)
Bilirubin, Direct: 0.2 mg/dL (ref 0.0–0.3)
Total Bilirubin: 1.1 mg/dL (ref 0.2–1.2)
Total Protein: 7.1 g/dL (ref 6.0–8.3)

## 2020-10-25 LAB — CBC WITH DIFFERENTIAL/PLATELET
Basophils Absolute: 0 10*3/uL (ref 0.0–0.1)
Basophils Relative: 0.6 % (ref 0.0–3.0)
Eosinophils Absolute: 0.1 10*3/uL (ref 0.0–0.7)
Eosinophils Relative: 1.6 % (ref 0.0–5.0)
HCT: 37.3 % — ABNORMAL LOW (ref 39.0–52.0)
Hemoglobin: 12.4 g/dL — ABNORMAL LOW (ref 13.0–17.0)
Lymphocytes Relative: 55.9 % — ABNORMAL HIGH (ref 12.0–46.0)
Lymphs Abs: 3.3 10*3/uL (ref 0.7–4.0)
MCHC: 33.2 g/dL (ref 30.0–36.0)
MCV: 88.5 fl (ref 78.0–100.0)
Monocytes Absolute: 0.6 10*3/uL (ref 0.1–1.0)
Monocytes Relative: 9.4 % (ref 3.0–12.0)
Neutro Abs: 1.9 10*3/uL (ref 1.4–7.7)
Neutrophils Relative %: 32.5 % — ABNORMAL LOW (ref 43.0–77.0)
Platelets: 250 10*3/uL (ref 150.0–400.0)
RBC: 4.21 Mil/uL — ABNORMAL LOW (ref 4.22–5.81)
RDW: 12.2 % (ref 11.5–15.5)
WBC: 6 10*3/uL (ref 4.0–10.5)

## 2020-10-25 LAB — BASIC METABOLIC PANEL
BUN: 12 mg/dL (ref 6–23)
CO2: 30 mEq/L (ref 19–32)
Calcium: 9.7 mg/dL (ref 8.4–10.5)
Chloride: 103 mEq/L (ref 96–112)
Creatinine, Ser: 1.29 mg/dL (ref 0.40–1.50)
GFR: 60.11 mL/min (ref >60.00)
Glucose, Bld: 86 mg/dL (ref 70–99)
Potassium: 4.3 mEq/L (ref 3.5–5.1)
Sodium: 140 mEq/L (ref 135–145)

## 2021-01-08 ENCOUNTER — Other Ambulatory Visit: Payer: Self-pay

## 2021-01-08 ENCOUNTER — Encounter (HOSPITAL_COMMUNITY): Payer: Self-pay

## 2021-01-08 ENCOUNTER — Emergency Department (HOSPITAL_COMMUNITY): Payer: BC Managed Care – PPO

## 2021-01-08 ENCOUNTER — Emergency Department (HOSPITAL_COMMUNITY)
Admission: EM | Admit: 2021-01-08 | Discharge: 2021-01-08 | Disposition: A | Discharge status: Home / Self Care | Payer: BC Managed Care – PPO | Attending: Emergency Medicine | Admitting: Emergency Medicine

## 2021-01-08 ENCOUNTER — Telehealth: Payer: Self-pay | Admitting: Internal Medicine

## 2021-01-08 DIAGNOSIS — M546 Pain in thoracic spine: Secondary | ICD-10-CM | POA: Insufficient documentation

## 2021-01-08 DIAGNOSIS — J45909 Unspecified asthma, uncomplicated: Secondary | ICD-10-CM | POA: Insufficient documentation

## 2021-01-08 DIAGNOSIS — Z79899 Other long term (current) drug therapy: Secondary | ICD-10-CM | POA: Insufficient documentation

## 2021-01-08 DIAGNOSIS — R0789 Other chest pain: Secondary | ICD-10-CM

## 2021-01-08 DIAGNOSIS — Z7982 Long term (current) use of aspirin: Secondary | ICD-10-CM | POA: Insufficient documentation

## 2021-01-08 DIAGNOSIS — R079 Chest pain, unspecified: Secondary | ICD-10-CM | POA: Diagnosis present

## 2021-01-08 DIAGNOSIS — I10 Essential (primary) hypertension: Secondary | ICD-10-CM | POA: Insufficient documentation

## 2021-01-08 LAB — BASIC METABOLIC PANEL
Anion gap: 7 (ref 5–15)
BUN: 10 mg/dL (ref 8–23)
CO2: 28 mmol/L (ref 22–32)
Calcium: 9.9 mg/dL (ref 8.9–10.3)
Chloride: 102 mmol/L (ref 98–111)
Creatinine, Ser: 1.11 mg/dL (ref 0.61–1.24)
GFR, Estimated: >60 mL/min (ref >60)
Glucose, Bld: 114 mg/dL — ABNORMAL HIGH (ref 70–99)
Potassium: 4 mmol/L (ref 3.5–5.1)
Sodium: 137 mmol/L (ref 135–145)

## 2021-01-08 LAB — CBC
HCT: 41.5 % (ref 39.0–52.0)
Hemoglobin: 13.9 g/dL (ref 13.0–17.0)
MCH: 29.2 pg (ref 26.0–34.0)
MCHC: 33.5 g/dL (ref 30.0–36.0)
MCV: 87.2 fL (ref 80.0–100.0)
Platelets: 244 10*3/uL (ref 150–400)
RBC: 4.76 MIL/uL (ref 4.22–5.81)
RDW: 11.7 % (ref 11.5–15.5)
WBC: 5.4 10*3/uL (ref 4.0–10.5)
nRBC: 0 % (ref 0.0–0.2)

## 2021-01-08 LAB — TROPONIN I (HIGH SENSITIVITY)
Troponin I (High Sensitivity): 7 ng/L (ref <18)
Troponin I (High Sensitivity): 6 ng/L (ref <18)

## 2021-01-08 MED ORDER — NEBIVOLOL HCL 10 MG PO TABS: 20.0000 mg | ORAL_TABLET | Freq: Every day | ORAL | 0 refills | Status: DC

## 2021-01-08 MED ORDER — OMEPRAZOLE 20 MG PO CPDR: 20.0000 mg | DELAYED_RELEASE_CAPSULE | Freq: Every day | ORAL | 0 refills | Status: DC

## 2021-01-08 NOTE — ED Triage Notes (Signed)
Patient arrived by [facility] for 5 days. Patient states that he has been taking his BP meds but  found to be greater than 550 systolic pta. Patient alert and oriented

## 2021-01-08 NOTE — ED Provider Notes (Signed)
[facility] EMERGENCY DEPARTMENT Provider Note   CSN: [id number] Arrival date & time: 01/08/21  1033     History Chief Complaint  Patient presents with   Chest Pain    Brad Hayes. is a 60 y.o. male seen emergency department with 4 days of constant chest pain.  Patient reports that it is central and described as heavy.  It is not worse with exertion or movement.  Denies associated shortness of breath, syncope, near syncope, leg pain, leg swelling.  No family history of early cardiac death.  No recent travel, swelling of his legs or history of blood clot.  No hormone replacement therapy or testosterone usage.  Patient has a history of hypertension.  He takes his Bystolic daily but does not regularly check his blood pressure.  Readily admits that he drinks caffeine, beer and does not particularly monitor his diet.  Denies previous episodes of chest pain.  Patient does endorse some GERD symptoms stating that in the last few weeks has had to get up at night and drink something because he felt the acid in the back of his throat.  Reports he started drinking coffee again approximately 1 month ago and wife at bedside reports an increase in beer drinking over the last few weeks.  Patient does endorse some left paraspinal back pain for approximately 1 week since picking up a box of printer paper at work but no other known injuries.  Numbness, tingling or weakness.  The history is provided by the patient, medical records, the spouse and a relative. No language interpreter was used.   HPI: A 60 year old patient with a history of hypertension presents for evaluation of chest pain. Initial onset of pain was more than 6 hours ago. The patient's chest pain is described as heaviness/pressure/tightness and is not worse with exertion. The patient's chest pain is middle- or left-sided, is not well-localized, is not sharp and does not radiate to the arms/jaw/neck. The patient does not complain  of nausea and denies diaphoresis. The patient has no history of stroke, has no history of peripheral artery disease, has not smoked in the past 90 days, denies any history of treated diabetes, has no relevant family history of coronary artery disease (first degree relative at less than age 83), has no history of hypercholesterolemia and does not have an elevated BMI (>=30).   Past Medical History:  Diagnosis Date   Chronic insomnia    Hypertension    OSA (obstructive sleep apnea)     Patient Active Problem List   Diagnosis Date Noted   Upper airway cough syndrome 04/27/2020   Encounter for general adult medical examination with abnormal findings 01/25/2020   Hyperlipidemia LDL goal <70 01/25/2020   Vitamin D deficiency disease 01/25/2020   OSA (obstructive sleep apnea)    Diuretic-induced hypokalemia 05/12/2019   Chronic bilateral low back pain without sciatica 04/27/2018   NASH (nonalcoholic steatohepatitis) 04/27/2018   History of colonic polyps 03/04/2016   Asthma 02/01/2015   Allergic rhinitis 06/23/2014   Hyperglycemia 03/10/2012   Insomnia with sleep apnea 03/21/2010   Sleep apnea 03/21/2010   ERECTILE DYSFUNCTION 03/22/2009   Essential hypertension, benign 03/22/2009    Past Surgical History:  Procedure Laterality Date   dental extraction     INGUINAL HERNIA REPAIR N/A 03/02/2019   Procedure: LAPAROSCOPIC RIGHT INGUINAL HERNIA REPAIR;  Surgeon: Jesusita Oka, MD;  Location: [place];  Service: General;  Laterality: N/A;   UMBILICAL HERNIA REPAIR N/A 03/02/2019  Procedure: UMBILICAL HERNIA REPAIR WITH MESH;  Surgeon: Jesusita Oka, MD;  Location: [facility];  Service: General;  Laterality: N/A;       Family History  Problem Relation Age of Onset   Hypertension Mother    Sickle cell anemia Mother    Alcohol abuse Father    Hypertension Sister    Hypertension Brother    Stroke Neg Hx    Kidney disease Neg Hx    Heart disease Neg Hx    Hyperlipidemia Neg Hx    Early  death Neg Hx    Diabetes Neg Hx    COPD Neg Hx    Cancer Neg Hx     Social History   Tobacco Use   Smoking status: Never   Smokeless tobacco: Never  Substance Use Topics   Alcohol use: Yes    Alcohol/week: 28.0 standard drinks    Types: 28 Cans of beer per week    Comment: occassional   Drug use: No    Home Medications Prior to Admission medications   Medication Sig Start Date End Date Taking? Authorizing Provider  aspirin EC 81 MG tablet Take 325 mg by mouth once. Swallow whole.   Yes [provider]  beclomethasone (QVAR REDIHALER) 40 MCG/ACT inhaler Inhale 1 puff into the lungs 2 (two) times daily. 10/24/20  Yes Janith Lima, MD  nitroGLYCERIN (NITROSTAT) 0.4 MG SL tablet Place 0.4 mg under the tongue once.   Yes [provider]  omeprazole (PRILOSEC) 20 MG capsule Take 1 capsule (20 mg total) by mouth daily. 01/08/21 02/07/21 Yes Yvonnie Schinke, Jarrett Soho, PA-C  benzonatate (TESSALON) 200 MG capsule Take 1 capsule (200 mg total) by mouth 3 (three) times daily as needed for cough. Patient not taking: No sig reported 04/27/20   Tanda Rockers, MD  HYDROcodone bit-homatropine (HYCODAN) 5-1.5 MG/5ML syrup Take 5 mLs by mouth every 8 (eight) hours as needed for cough. Patient not taking: Reported on 01/08/2021 10/24/20   Janith Lima, MD  nebivolol (BYSTOLIC) 10 MG tablet Take 2 tablets (20 mg total) by mouth daily. 01/08/21 02/07/21  Watt Geiler, Jarrett Soho, PA-C  sildenafil (REVATIO) 20 MG tablet Take 4 tablets (80 mg total) by mouth daily as needed. Patient not taking: No sig reported 10/24/20   Janith Lima, MD  triamterene-hydrochlorothiazide (MAXZIDE-25) 37.5-25 MG tablet Take 1 tablet by mouth daily. Patient not taking: Reported on 01/08/2021 10/24/20   Janith Lima, MD    Allergies    Patient has no known allergies.  Review of Systems   Review of Systems  Constitutional:  Negative for appetite change, diaphoresis, fatigue, fever and unexpected weight  change.  HENT:  Negative for mouth sores.   Eyes:  Negative for visual disturbance.  Respiratory:  Negative for cough, chest tightness, shortness of breath and wheezing.   Cardiovascular:  Positive for chest pain.  Gastrointestinal:  Negative for abdominal pain, constipation, diarrhea, nausea and vomiting.  Endocrine: Negative for polydipsia, polyphagia and polyuria.  Genitourinary:  Negative for dysuria, frequency, hematuria and urgency.  Musculoskeletal:  Negative for back pain and neck stiffness.  Skin:  Negative for rash.  Allergic/Immunologic: Negative for immunocompromised state.  Neurological:  Negative for syncope, light-headedness and headaches.  Hematological:  Does not bruise/bleed easily.  Psychiatric/Behavioral:  Negative for sleep disturbance. The patient is not nervous/anxious.    Physical Exam Updated Vital Signs BP (!) 180/110   Pulse 62   Temp 97.7 F (36.5 C) (Oral)   Resp (!) 23  SpO2 99%   Physical Exam Vitals and nursing note reviewed.  Constitutional:      General: He is not in acute distress.    Appearance: He is not diaphoretic.  HENT:     Head: Normocephalic.  Eyes:     General: No scleral icterus.    Conjunctiva/sclera: Conjunctivae normal.  Cardiovascular:     Rate and Rhythm: Normal rate and regular rhythm.     Pulses: Normal pulses.          Radial pulses are 2+ on the right side and 2+ on the left side.     Heart sounds: Normal heart sounds.  Pulmonary:     Effort: Pulmonary effort is normal. No tachypnea, accessory muscle usage, prolonged expiration, respiratory distress or retractions.     Breath sounds: Normal breath sounds. No stridor.     Comments: Equal chest rise. No increased work of breathing. Abdominal:     General: There is no distension.     Palpations: Abdomen is soft.     Tenderness: There is no abdominal tenderness. There is no guarding or rebound.  Musculoskeletal:        General: Normal range of motion.     Cervical  back: Normal and normal range of motion.     Thoracic back: Tenderness present. No bony tenderness. Normal range of motion.     Lumbar back: Normal.       Back:     Right lower leg: No tenderness. No edema.     Left lower leg: No tenderness. No edema.     Comments: Moves all extremities equally and without difficulty.  Skin:    General: Skin is warm and dry.     Capillary Refill: Capillary refill takes less than 2 seconds.  Neurological:     Mental Status: He is alert.     GCS: GCS eye subscore is 4. GCS verbal subscore is 5. GCS motor subscore is 6.     Comments: Speech is clear and goal oriented.  Psychiatric:        Mood and Affect: Mood normal.    ED Results / Procedures / Treatments   Labs (all labs ordered are listed, but only abnormal results are displayed) Labs Reviewed  BASIC METABOLIC PANEL - Abnormal; Notable for the following components:      Result Value   Glucose, Bld 114 (*)    All other components within normal limits  CBC  TROPONIN I (HIGH SENSITIVITY)  TROPONIN I (HIGH SENSITIVITY)    EKG EKG Interpretation  Date/Time:  Monday January 08 2021 11:51:31 EST Ventricular Rate:  73 PR Interval:  154 QRS Duration: 88 QT Interval:  376 QTC Calculation: 414 R Axis:   -27 Text Interpretation: Sinus rhythm with occasional Premature ventricular complexes and Premature atrial complexes Confirmed by Octaviano Glow [phone]) on 01/08/2021 4:55:06 PM  Radiology DG Chest 2 View  Result Date: 01/08/2021 CLINICAL DATA:  Chest pain EXAM: CHEST - 2 VIEW COMPARISON:  04/27/2020 FINDINGS: Mild lingular scarring. No focal consolidation. No pleural effusion or pneumothorax. Heart and mediastinal contours are unremarkable. No acute osseous abnormality. IMPRESSION: No active cardiopulmonary disease. Electronically Signed   By: Kathreen Devoid M.D.   On: 01/08/2021 12:11    Procedures Procedures   Medications Ordered in ED Medications - No data to display  ED Course  I  have reviewed the triage vital signs and the nursing notes.  Pertinent labs & imaging results that were available during my care of  the patient were reviewed by me and considered in my medical decision making (see chart for details).  Clinical Course as of 01/08/21 1757  Mon Jan 08, 2021  1757 BP(!): 180/110 Noted - no evidence of hypertensive urgency - will increase Bystolic [HM]    Clinical Course User Index [HM] Dorothee Napierkowski, Gwenlyn Perking   MDM Rules/Calculators/A&P HEAR Score: 3                         Patient presents with 5 days of chest pain, constant and nonexertional.  Does endorse some symptoms of GERD.  Work-up here is reassuring.  Chest x-ray without pulmonary edema, pneumothorax or pneumonia.  EKG without ischemia.  Initial and repeat troponin within normal limits.  No electrolyte abnormalities.  Creatinine within normal limits.  Back pain is pinpoint and reproducible.  Highly doubt pulmonary embolism as patient is low risk and without clinical findings to suggest PE/DVT.  Discussed diet and exercise for blood pressure control.  Until then will increase by Bystolic dose.  Patient is to check his blood pressure daily and follow-up with his primary care in 1 week for repeat blood pressure check.  Discussed reasons to return to the emergency department.  Patient states understanding and is in agreement with plan.   Final Clinical Impression(s) / ED Diagnoses Final diagnoses:  Atypical chest pain  Hypertension, unspecified type    Rx / DC Orders ED Discharge Orders          Ordered    nebivolol (BYSTOLIC) 10 MG tablet  Daily        01/08/21 1755    omeprazole (PRILOSEC) 20 MG capsule  Daily        01/08/21 1756             Yakelin Grenier, Gwenlyn Perking 01/08/21 1757    Wyvonnia Dusky, MD 01/08/21 2123

## 2021-01-08 NOTE — Telephone Encounter (Signed)
Pt. Connected to [facility].   Caller states he is experiencing sharp chest pain for 4 days now and it hurts when he takes a deep breath. Constant pain at a 6/10, deep breath is 8-9/10. No cough.   Pt. Advised to call EMS 911. Pt. Understands and complied.

## 2021-01-08 NOTE — ED Notes (Signed)
Pt unable to sign for d/c due to computer not working.

## 2021-01-08 NOTE — Telephone Encounter (Signed)
Patient calling in with  respiratory symptoms: Shortness of breath, chest pain, palpitations or other red words send to Triage  Patient says he has been having sharp left chest pain for about 4 days..hurts when he takes a deep breath... Scheduled next available appt for 11/15 @ 9:40 but also transferred to [facility]

## 2021-01-08 NOTE — Discharge Instructions (Addendum)
1. Medications: Increase by bystolic to 20 mg/day, start Prilosec, usual home medications 2. Treatment: rest, drink plenty of fluids, read handout about food choices for GERD 3. Follow Up: Please followup with your primary doctor in 7 days for discussion of your diagnoses and further evaluation after today's visit; if you do not have a primary care doctor use the resource guide provided to find one; Please return to the ER for new or worsening symptoms

## 2021-01-09 ENCOUNTER — Ambulatory Visit: Payer: BC Managed Care – PPO | Admitting: Internal Medicine

## 2021-01-20 ENCOUNTER — Other Ambulatory Visit: Payer: Self-pay | Admitting: Internal Medicine

## 2021-01-20 DIAGNOSIS — E876 Hypokalemia: Secondary | ICD-10-CM

## 2021-01-20 DIAGNOSIS — I10 Essential (primary) hypertension: Secondary | ICD-10-CM

## 2021-01-20 DIAGNOSIS — T502X5A Adverse effect of carbonic-anhydrase inhibitors, benzothiadiazides and other diuretics, initial encounter: Secondary | ICD-10-CM

## 2021-01-22 ENCOUNTER — Other Ambulatory Visit: Payer: Self-pay

## 2021-01-22 ENCOUNTER — Ambulatory Visit: Payer: BC Managed Care – PPO | Admitting: Internal Medicine

## 2021-01-22 ENCOUNTER — Encounter: Payer: Self-pay | Admitting: Internal Medicine

## 2021-01-22 DIAGNOSIS — J4540 Moderate persistent asthma, uncomplicated: Secondary | ICD-10-CM

## 2021-01-22 DIAGNOSIS — I10 Essential (primary) hypertension: Secondary | ICD-10-CM | POA: Diagnosis not present

## 2021-01-22 MED ORDER — NEBIVOLOL HCL 20 MG PO TABS: 20.0000 mg | ORAL_TABLET | Freq: Every day | ORAL | 1 refills | Status: DC

## 2021-01-22 NOTE — Patient Instructions (Signed)
We have refilled the bystolic 20 mg to keep taking daily.

## 2021-01-22 NOTE — Progress Notes (Signed)
   Subjective:   Patient ID: Brad Savannah., male    DOB: Sep 15, 1960, 60 y.o.   MRN: [id number]  HPI The patient is a 60 YO man coming in for ER follow up. Bystolic increased to 20 mg daily (from 10 mg daily). Doing well with this  PMH, [facility], social history reviewed and updated  Review of Systems  Constitutional: Negative.   HENT: Negative.    Eyes: Negative.   Respiratory:  Negative for cough, chest tightness and shortness of breath.   Cardiovascular:  Negative for chest pain, palpitations and leg swelling.  Gastrointestinal:  Negative for abdominal distention, abdominal pain, constipation, diarrhea, nausea and vomiting.  Musculoskeletal: Negative.   Skin: Negative.   Neurological: Negative.   Psychiatric/Behavioral: Negative.     Objective:  Physical Exam Constitutional:      Appearance: He is well-developed.  HENT:     Head: Normocephalic and atraumatic.  Cardiovascular:     Rate and Rhythm: Normal rate and regular rhythm.  Pulmonary:     Effort: Pulmonary effort is normal. No respiratory distress.     Breath sounds: Normal breath sounds. No wheezing or rales.  Abdominal:     General: Bowel sounds are normal. There is no distension.     Palpations: Abdomen is soft.     Tenderness: There is no abdominal tenderness. There is no rebound.  Musculoskeletal:     Cervical back: Normal range of motion.  Skin:    General: Skin is warm and dry.  Neurological:     Mental Status: He is alert and oriented to person, place, and time.     Coordination: Coordination normal.    Vitals:   01/22/21 1413  BP: 132/82  Pulse: 64  Resp: 18  SpO2: 98%  Weight: 243 lb (110.2 kg)  Height: 5' 11.5" (1.816 m)    This visit occurred during the SARS-CoV-2 public health emergency.  Safety protocols were in place, including screening questions prior to the visit, additional usage of staff PPE, and extensive cleaning of exam room while observing appropriate contact time as indicated for  disinfecting solutions.   Assessment & Plan:  Visit time 20 minutes in face to face communication with patient and coordination of care, additional 10 minutes spent in record review, coordination or care, ordering tests, communicating/referring to other healthcare professionals, documenting in medical records all on the same day of the visit for total time 30 minutes spent on the visit.

## 2021-01-22 NOTE — Assessment & Plan Note (Signed)
Taking bystolic 20 mg daily since ER 2 weeks ago. HR 64 and no side effects. BP at goal. Will continue and updated rx to bystolic 20 mg daily. Reviewed labs with him. Removed hctz which he has not taken in "a long time".

## 2021-01-22 NOTE — Assessment & Plan Note (Signed)
Denies taking qvar and wishes removed from list. No SOB.

## 2021-08-14 ENCOUNTER — Emergency Department (HOSPITAL_COMMUNITY)
Admission: EM | Admit: 2021-08-14 | Discharge: 2021-08-14 | Disposition: A | Discharge status: Home / Self Care | Payer: BC Managed Care – PPO | Attending: Emergency Medicine | Admitting: Emergency Medicine

## 2021-08-14 ENCOUNTER — Emergency Department (HOSPITAL_COMMUNITY): Payer: BC Managed Care – PPO

## 2021-08-14 ENCOUNTER — Other Ambulatory Visit: Payer: Self-pay

## 2021-08-14 DIAGNOSIS — X500XXA Overexertion from strenuous movement or load, initial encounter: Secondary | ICD-10-CM | POA: Diagnosis not present

## 2021-08-14 DIAGNOSIS — Z79899 Other long term (current) drug therapy: Secondary | ICD-10-CM | POA: Insufficient documentation

## 2021-08-14 DIAGNOSIS — R519 Headache, unspecified: Secondary | ICD-10-CM | POA: Insufficient documentation

## 2021-08-14 DIAGNOSIS — I10 Essential (primary) hypertension: Secondary | ICD-10-CM | POA: Insufficient documentation

## 2021-08-14 DIAGNOSIS — S39012A Strain of muscle, fascia and tendon of lower back, initial encounter: Secondary | ICD-10-CM | POA: Insufficient documentation

## 2021-08-14 DIAGNOSIS — Y990 Civilian activity done for income or pay: Secondary | ICD-10-CM | POA: Insufficient documentation

## 2021-08-14 DIAGNOSIS — M549 Dorsalgia, unspecified: Secondary | ICD-10-CM | POA: Diagnosis present

## 2021-08-14 LAB — CBC WITH DIFFERENTIAL/PLATELET
Abs Immature Granulocytes: 0.01 10*3/uL (ref 0.00–0.07)
Basophils Absolute: 0 10*3/uL (ref 0.0–0.1)
Basophils Relative: 0 %
Eosinophils Absolute: 0.1 10*3/uL (ref 0.0–0.5)
Eosinophils Relative: 1 %
HCT: 37.5 % — ABNORMAL LOW (ref 39.0–52.0)
Hemoglobin: 13 g/dL (ref 13.0–17.0)
Immature Granulocytes: 0 %
Lymphocytes Relative: 45 %
Lymphs Abs: 2.4 10*3/uL (ref 0.7–4.0)
MCH: 30.6 pg (ref 26.0–34.0)
MCHC: 34.7 g/dL (ref 30.0–36.0)
MCV: 88.2 fL (ref 80.0–100.0)
Monocytes Absolute: 0.4 10*3/uL (ref 0.1–1.0)
Monocytes Relative: 7 %
Neutro Abs: 2.5 10*3/uL (ref 1.7–7.7)
Neutrophils Relative %: 47 %
Platelets: 240 10*3/uL (ref 150–400)
RBC: 4.25 MIL/uL (ref 4.22–5.81)
RDW: 11.9 % (ref 11.5–15.5)
WBC: 5.3 10*3/uL (ref 4.0–10.5)
nRBC: 0 % (ref 0.0–0.2)

## 2021-08-14 LAB — BASIC METABOLIC PANEL
Anion gap: 8 (ref 5–15)
BUN: 15 mg/dL (ref 8–23)
CO2: 26 mmol/L (ref 22–32)
Calcium: 9.2 mg/dL (ref 8.9–10.3)
Chloride: 104 mmol/L (ref 98–111)
Creatinine, Ser: 1.22 mg/dL (ref 0.61–1.24)
GFR, Estimated: >60 mL/min (ref >60)
Glucose, Bld: 115 mg/dL — ABNORMAL HIGH (ref 70–99)
Potassium: 4.4 mmol/L (ref 3.5–5.1)
Sodium: 138 mmol/L (ref 135–145)

## 2021-08-14 LAB — URINALYSIS, ROUTINE W REFLEX MICROSCOPIC
Bilirubin Urine: NEGATIVE
Glucose, UA: NEGATIVE mg/dL
Hgb urine dipstick: NEGATIVE
Ketones, ur: 5 mg/dL — AB
Leukocytes,Ua: NEGATIVE
Nitrite: NEGATIVE
Protein, ur: NEGATIVE mg/dL
Specific Gravity, Urine: 1.023 (ref 1.005–1.030)
pH: 5 (ref 5.0–8.0)

## 2021-08-14 MED ORDER — KETOROLAC TROMETHAMINE 60 MG/2ML IM SOLN
30.0000 mg | Freq: Once | INTRAMUSCULAR | Status: AC
  Administered 2021-08-14: 30 mg via INTRAMUSCULAR
  Filled 2021-08-14: qty 2

## 2021-08-14 MED ORDER — LIDOCAINE 5 % EX PTCH: 1.0000 | MEDICATED_PATCH | CUTANEOUS | 0 refills | Status: AC

## 2021-08-14 MED ORDER — CYCLOBENZAPRINE HCL 10 MG PO TABS: 10.0000 mg | ORAL_TABLET | Freq: Two times a day (BID) | ORAL | 0 refills | Status: AC | PRN

## 2021-08-14 MED ORDER — LIDOCAINE 5 % EX PTCH
1.0000 | MEDICATED_PATCH | CUTANEOUS | Status: DC
  Administered 2021-08-14: 1 via TRANSDERMAL
  Filled 2021-08-14: qty 1

## 2021-08-14 MED ORDER — OXYCODONE-ACETAMINOPHEN 5-325 MG PO TABS
1.0000 | ORAL_TABLET | Freq: Once | ORAL | Status: AC
  Administered 2021-08-14: 1 via ORAL
  Filled 2021-08-14: qty 1

## 2021-08-14 MED ORDER — NAPROXEN 500 MG PO TABS
500.0000 mg | ORAL_TABLET | Freq: Two times a day (BID) | ORAL | 0 refills | Status: AC
Start: 2021-08-14 — End: ?

## 2021-08-14 NOTE — ED Triage Notes (Signed)
Pt arrived POV with c/c of Back pain and hypertension. Pt was at work and went to the on-campus providers for back pain and was sent due to having high blood pressure. Pt states that he hurt his back while lifting a heavy couch with a friend.

## 2021-08-14 NOTE — ED Provider Triage Note (Signed)
Emergency Medicine Provider Triage Evaluation Note  Brad Hayes. , a 61 y.o. male  was evaluated in triage.  Pt complains of back pain onset today.  Patient notes that he was lifting a couch prior to the onset of his right lower back pain.  Denies bowel/bladder incontinence, numbness, tingling, weakness, gait problem, saddle paresthesia.  No meds tried prior to arrival.  Also notes that he was evaluated on campus provider and noted to have elevated blood pressure.  Has a history of hypertension and is compliant with his medications however patient has not been evaluated by his primary care provider recently.  Has associated throbbing headache.  Denies blurred vision, double vision. No chest pain or shortness of breath  Review of Systems  Positive: As per HPI above Negative:   Physical Exam  BP (!) 203/96 (BP Location: Right Arm)   Pulse (!) 58   Temp 98.4 F (36.9 C) (Oral)   Resp 18   Ht 6' (1.829 m)   Wt 110.2 kg   SpO2 99%   BMI 32.96 kg/m  Gen:   Awake, no distress   Resp:  Normal effort  MSK:   Moves extremities without difficulty  Other:  No spinal tenderness to palpation.  No tenderness to palpation noted to musculature of back.  Patient able to ambulate without assistance or difficulty.  No focal neurological deficits on exam.  Cranial nerves II through XII intact.  Negative pronator drift.  Medical Decision Making  Medically screening exam initiated at 12:26 PM.  Appropriate orders placed.  Brad Arnetha Gula. was informed that the remainder of the evaluation will be completed by another provider, this initial triage assessment does not replace that evaluation, and the importance of remaining in the ED until their evaluation is complete.  Work-up initiated   Ryker Sudbury A, PA-C 08/14/21 1230

## 2021-08-14 NOTE — Discharge Instructions (Addendum)
Please return to the emergency department in the setting of lower extremity weakness/numbness and back pain, numbness around your groin, urinary or fecal incontinence, inability to ambulate, worsening severe pain in the setting of fall or trauma.

## 2021-08-14 NOTE — ED Provider Notes (Signed)
[facility] EMERGENCY DEPARTMENT Provider Note   CSN: [id number] Arrival date & time: 08/14/21  1113     History  Chief Complaint  Patient presents with   Hypertension   Back Pain    Brad Hayes. is a 61 y.o. male.   Hypertension  Back Pain   61 year old male presenting to the emergency department with acute onset back pain and hypertension.  The patient states that he was lifting a couch at work when he developed sudden onset sharp pain in his right back that now radiates across his back.  He denies any radiation down his legs.  He denies any numbness, weakness, gait abnormality, saddle anesthesia.  He denies any urinary or fecal incontinence.  He denies any falls.  He does have a history of hypertension.  While on scene, the patient was found to be markedly hypertensive and was advised to be seen in the emergency department for further evaluation.  He denies any chest pain, shortness of breath.  He did endorse a headache in triage which is since resolved.  He denies any blurry vision or double vision.  He denied any other neurologic deficits or complaints.  Home Medications Prior to Admission medications   Medication Sig Start Date End Date Taking? Authorizing Provider  cyclobenzaprine (FLEXERIL) 10 MG tablet Take 1 tablet (10 mg total) by mouth 2 (two) times daily as needed for muscle spasms. 08/14/21  Yes Regan Lemming, MD  lidocaine (LIDODERM) 5 % Place 1 patch onto the skin daily. Remove & Discard patch within 12 hours or as directed by MD 08/14/21  Yes Regan Lemming, MD  naproxen (NAPROSYN) 500 MG tablet Take 1 tablet (500 mg total) by mouth 2 (two) times daily. 08/14/21  Yes Regan Lemming, MD  Nebivolol HCl (BYSTOLIC) 20 MG TABS Take 1 tablet (20 mg total) by mouth daily. 01/22/21   Hoyt Koch, MD      Allergies    Patient has no known allergies.    Review of Systems   Review of Systems  Musculoskeletal:  Positive for back pain.  All  other systems reviewed and are negative.   Physical Exam Updated Vital Signs BP (!) 159/93 (BP Location: Right Arm)   Pulse (!) 53   Temp 98.4 F (36.9 C) (Oral)   Resp 16   Ht 6' (1.829 m)   Wt 110.2 kg   SpO2 100%   BMI 32.96 kg/m  Physical Exam Vitals and nursing note reviewed.  Constitutional:      General: He is not in acute distress.    Appearance: He is well-developed.  HENT:     Head: Normocephalic and atraumatic.  Eyes:     Conjunctiva/sclera: Conjunctivae normal.  Cardiovascular:     Rate and Rhythm: Normal rate and regular rhythm.  Pulmonary:     Effort: Pulmonary effort is normal. No respiratory distress.     Breath sounds: Normal breath sounds.  Abdominal:     Palpations: Abdomen is soft.     Tenderness: There is no abdominal tenderness.  Musculoskeletal:        General: Tenderness present. No swelling.     Cervical back: Neck supple.     Comments: No midline TTP of the spine. Bilateral paraspinal TTP. Negative straight leg raise bilaterally.  Skin:    General: Skin is warm and dry.     Capillary Refill: Capillary refill takes less than 2 seconds.  Neurological:     Mental Status: He is alert.  Comments: MENTAL STATUS EXAM:    Orientation: Alert and oriented to person, place and time.  Memory: Cooperative, follows commands well.  Language: Speech is clear and language is normal.   CRANIAL NERVES:    CN 2 (Optic): Visual fields intact to confrontation.  CN 3,4,6 (EOM): Pupils equal and reactive to light. Full extraocular eye movement without nystagmus.  CN 5 (Trigeminal): Facial sensation is normal, no weakness of masticatory muscles.  CN 7 (Facial): No facial weakness or asymmetry.  CN 8 (Auditory): Auditory acuity grossly normal.  CN 9,10 (Glossophar): The uvula is midline, the palate elevates symmetrically.  CN 11 (spinal access): Normal sternocleidomastoid and trapezius strength.  CN 12 (Hypoglossal): The tongue is midline. No atrophy or  fasciculations.Marland Kitchen   MOTOR:  Muscle Strength: 5/5RUE, 5/5LUE, 5/5RLE, 5/5LLE.   COORDINATION:   Intact finger-to-nose, no tremor.   SENSATION:   Intact to light touch all four extremities.  GAIT: Gait normal without ataxia   Psychiatric:        Mood and Affect: Mood normal.     ED Results / Procedures / Treatments   Labs (all labs ordered are listed, but only abnormal results are displayed) Labs Reviewed  BASIC METABOLIC PANEL - Abnormal; Notable for the following components:      Result Value   Glucose, Bld 115 (*)    All other components within normal limits  CBC WITH DIFFERENTIAL/PLATELET - Abnormal; Notable for the following components:   HCT 37.5 (*)    All other components within normal limits  URINALYSIS, ROUTINE W REFLEX MICROSCOPIC - Abnormal; Notable for the following components:   Ketones, ur 5 (*)    All other components within normal limits    EKG None  Radiology CT Head Wo Contrast  Result Date: 08/14/2021 CLINICAL DATA:  Hypertension.  New or worsening headache. EXAM: CT HEAD WITHOUT CONTRAST TECHNIQUE: Contiguous axial images were obtained from the base of the skull through the vertex without intravenous contrast. RADIATION DOSE REDUCTION: This exam was performed according to the departmental dose-optimization program which includes automated exposure control, adjustment of the mA and/or kV according to patient size and/or use of iterative reconstruction technique. COMPARISON:  CT brain 05/30/2006 FINDINGS: Brain: The ventricles are normal in size and configuration. The basilar cisterns are patent. No mass, mass effect, or midline shift. No acute intracranial hemorrhage is seen. No abnormal extra-axial fluid collection. Preservation of the normal cortical gray-white interface without CT evidence of an acute major vascular territorial cortical based infarction. Vascular: No hyperdense vessel or unexpected calcification. Skull: Normal. Negative for fracture or focal  lesion. Sinuses/Orbits: The visualized orbits are unremarkable. Small partially visualized mucosal polyp within the slightly anterior aspect of the medial wall of the left maxillary sinus. No air-fluid levels are seen within the paranasal sinuses. Mild opacification of some inferior mastoid air cells, minimally increased from prior. Other: None. Electronically Signed   By: Yvonne Kendall M.D.   On: 08/14/2021 13:21    Procedures Procedures    Medications Ordered in ED Medications  lidocaine (LIDODERM) 5 % 1 patch (1 patch Transdermal Patch Applied 08/14/21 1338)  oxyCODONE-acetaminophen (PERCOCET/ROXICET) 5-325 MG per tablet 1 tablet (1 tablet Oral Given 08/14/21 1238)  ketorolac (TORADOL) injection 30 mg (30 mg Intramuscular Given 08/14/21 1334)    ED Course/ Medical Decision Making/ A&P                           Medical Decision Making Risk  Prescription drug management.    61 year old male presenting to the emergency department with acute onset back pain and hypertension.  The patient states that he was lifting a couch at work when he developed sudden onset sharp pain in his right back that now radiates across his back.  He denies any radiation down his legs.  He denies any numbness, weakness, gait abnormality, saddle anesthesia.  He denies any urinary or fecal incontinence.  He denies any falls.  He does have a history of hypertension.  While on scene, the patient was found to be markedly hypertensive and was advised to be seen in the emergency department for further evaluation.  He denies any chest pain, shortness of breath.  He did endorse a headache in triage which is since resolved.  He denies any blurry vision or double vision.  He denied any other neurologic deficits or complaints.  The patient is able to ambulate and is hemodynamically stable. There are no red flag symptoms. Specifically, he denies: -Being on an anticoagulant or blood thinner -Using IV drugs -Having a history of  AAA -Having saddle anesthesia -Having urinary or fecal incontinence -Having any recent falls or trauma  The patient is able to ambulate.  Differential Diagnoses: I do not think that [employer]. is experiencing cauda equina syndrome, abdominal aortic aneurysm, epidural abscess, aortic dissection, spinal hematoma, nephrolithiasis, spinal metastasis, discitis, or an acute fracture.   While in the ED, I provided the patient with: Medications  lidocaine (LIDODERM) 5 % 1 patch (1 patch Transdermal Patch Applied 08/14/21 1338)  oxyCODONE-acetaminophen (PERCOCET/ROXICET) 5-325 MG per tablet 1 tablet (1 tablet Oral Given 08/14/21 1238)  ketorolac (TORADOL) injection 30 mg (30 mg Intramuscular Given 08/14/21 1334)    On reassessment, the patient was stable. This presentation is most consistent with lumbar strain and asymptomatic hypertension.  Given the patient's reassuring presentation, I believe that he is safe for discharge.  I provided ED return precautions, specifically for the symptoms which are most concerning (e.g., saddle anesthesia, urinary or bowel incontinence or retention, changing or worsening pain), which would necessitate immediate return.  I encouraged the patient to followup with their PCP.  Final Clinical Impression(s) / ED Diagnoses Final diagnoses:  Strain of lumbar region, initial encounter  Hypertension, unspecified type    Rx / DC Orders ED Discharge Orders          Ordered    lidocaine (LIDODERM) 5 %  Every 24 hours        08/14/21 1419    cyclobenzaprine (FLEXERIL) 10 MG tablet  2 times daily PRN        08/14/21 1419    naproxen (NAPROSYN) 500 MG tablet  2 times daily        08/14/21 1419              Regan Lemming, MD 08/14/21 1531

## 2021-09-11 ENCOUNTER — Emergency Department (HOSPITAL_BASED_OUTPATIENT_CLINIC_OR_DEPARTMENT_OTHER)
Admission: EM | Admit: 2021-09-11 | Discharge: 2021-09-11 | Disposition: A | Discharge status: Home / Self Care | Payer: BC Managed Care – PPO | Attending: Emergency Medicine | Admitting: Emergency Medicine

## 2021-09-11 ENCOUNTER — Encounter (HOSPITAL_BASED_OUTPATIENT_CLINIC_OR_DEPARTMENT_OTHER): Payer: Self-pay | Admitting: Emergency Medicine

## 2021-09-11 ENCOUNTER — Other Ambulatory Visit: Payer: Self-pay

## 2021-09-11 ENCOUNTER — Emergency Department (HOSPITAL_BASED_OUTPATIENT_CLINIC_OR_DEPARTMENT_OTHER): Payer: BC Managed Care – PPO | Admitting: Radiology

## 2021-09-11 ENCOUNTER — Other Ambulatory Visit (HOSPITAL_BASED_OUTPATIENT_CLINIC_OR_DEPARTMENT_OTHER): Payer: Self-pay

## 2021-09-11 DIAGNOSIS — R600 Localized edema: Secondary | ICD-10-CM | POA: Diagnosis not present

## 2021-09-11 DIAGNOSIS — Z79899 Other long term (current) drug therapy: Secondary | ICD-10-CM | POA: Diagnosis not present

## 2021-09-11 DIAGNOSIS — R42 Dizziness and giddiness: Secondary | ICD-10-CM | POA: Diagnosis present

## 2021-09-11 DIAGNOSIS — I10 Essential (primary) hypertension: Secondary | ICD-10-CM | POA: Diagnosis not present

## 2021-09-11 LAB — BASIC METABOLIC PANEL
Anion gap: 10 (ref 5–15)
BUN: 13 mg/dL (ref 8–23)
CO2: 25 mmol/L (ref 22–32)
Calcium: 9.6 mg/dL (ref 8.9–10.3)
Chloride: 103 mmol/L (ref 98–111)
Creatinine, Ser: 1.03 mg/dL (ref 0.61–1.24)
GFR, Estimated: >60 mL/min (ref >60)
Glucose, Bld: 112 mg/dL — ABNORMAL HIGH (ref 70–99)
Potassium: 3.9 mmol/L (ref 3.5–5.1)
Sodium: 138 mmol/L (ref 135–145)

## 2021-09-11 LAB — CBC WITH DIFFERENTIAL/PLATELET
Abs Immature Granulocytes: 0.01 10*3/uL (ref 0.00–0.07)
Basophils Absolute: 0 10*3/uL (ref 0.0–0.1)
Basophils Relative: 0 %
Eosinophils Absolute: 0.1 10*3/uL (ref 0.0–0.5)
Eosinophils Relative: 2 %
HCT: 35.3 % — ABNORMAL LOW (ref 39.0–52.0)
Hemoglobin: 12.2 g/dL — ABNORMAL LOW (ref 13.0–17.0)
Immature Granulocytes: 0 %
Lymphocytes Relative: 49 %
Lymphs Abs: 2.6 10*3/uL (ref 0.7–4.0)
MCH: 29.7 pg (ref 26.0–34.0)
MCHC: 34.6 g/dL (ref 30.0–36.0)
MCV: 85.9 fL (ref 80.0–100.0)
Monocytes Absolute: 0.5 10*3/uL (ref 0.1–1.0)
Monocytes Relative: 10 %
Neutro Abs: 2 10*3/uL (ref 1.7–7.7)
Neutrophils Relative %: 39 %
Platelets: 224 10*3/uL (ref 150–400)
RBC: 4.11 MIL/uL — ABNORMAL LOW (ref 4.22–5.81)
RDW: 11.7 % (ref 11.5–15.5)
WBC: 5.2 10*3/uL (ref 4.0–10.5)
nRBC: 0 % (ref 0.0–0.2)

## 2021-09-11 LAB — TROPONIN I (HIGH SENSITIVITY)
Troponin I (High Sensitivity): 4 ng/L (ref <18)
Troponin I (High Sensitivity): 5 ng/L (ref <18)

## 2021-09-11 LAB — BRAIN NATRIURETIC PEPTIDE: B Natriuretic Peptide: 28.1 pg/mL (ref 0.0–100.0)

## 2021-09-11 MED ORDER — AMLODIPINE BESYLATE 5 MG PO TABS
5.0000 mg | ORAL_TABLET | Freq: Every day | ORAL | 1 refills | Status: AC
  Filled 2021-09-11: qty 30, 30d supply, fill #0

## 2021-09-11 MED ORDER — AMLODIPINE BESYLATE 5 MG PO TABS
5.0000 mg | ORAL_TABLET | Freq: Once | ORAL | Status: AC
  Administered 2021-09-11: 5 mg via ORAL
  Filled 2021-09-11: qty 1

## 2021-09-11 NOTE — ED Triage Notes (Signed)
Pt arrives to ED with c/o hypertension. Pt reports he had an episode of diaphoresis at work this morning so he took his BP which read >200/100.Pt reports he takes Bystolic 93JP once daily for HTN.

## 2021-09-11 NOTE — ED Notes (Signed)
Patient verbalizes understanding of discharge instructions. Opportunity for questioning and answers were provided. Patient discharged from ED.  °

## 2021-09-11 NOTE — ED Provider Notes (Signed)
[facility] EMERGENCY DEPT Provider Note   CSN: [id number] Arrival date & time: 09/11/21  1009     History   Chief Complaint  Patient presents with   Hypertension    Brad Rein Popov. is a 61 y.o. male.   Hypertension   61 year old male with history of hypertension, hyperlipidemia presenting to the emergency department with hypertension.  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  Denies shortness of breath.  He reports that afterwards he checked his blood pressure and noticed 200 so he came to the emergency department.  He reports that he has been taking nebivolol as prescribed but his blood pressure is consistently elevated sometimes up to 893 systolic.  He has not seen a doctor over the last year as he changed insurance.  Reports 1 episode when climbing stairs with mild dyspnea and fatigue which is new for him.  Denies chest pain, syncope, shortness of breath, fevers, chills, nausea, vomiting.  Denies similar symptoms in the past.  Does report some leg swelling which has been gradually developing.     1 other episode when climbing stairs and havingHome Medications Prior to Admission medications   Medication Sig Start Date End Date Taking? Authorizing Provider  acetaminophen (TYLENOL) 325 MG tablet Take 650 mg by mouth every 6 (six) hours as needed. [place]   Yes [provider]  amLODipine (NORVASC) 5 MG tablet Take 1 tablet (5 mg total) by mouth daily. 09/11/21  Yes Cristie Hem, MD  cyclobenzaprine (FLEXERIL) 10 MG tablet Take 1 tablet (10 mg total) by mouth 2 (two) times daily as needed for muscle spasms. 08/14/21  Yes Regan Lemming, MD  Nebivolol HCl (BYSTOLIC) 20 MG TABS Take 1 tablet (20 mg total) by mouth daily. 01/22/21  Yes Hoyt Koch, MD  lidocaine (LIDODERM) 5 % Place 1 patch onto the skin daily. Remove & Discard patch  within 12 hours or as directed by MD 08/14/21   Regan Lemming, MD  naproxen (NAPROSYN) 500 MG tablet Take 1 tablet (500 mg total) by mouth 2 (two) times daily. Patient not taking: Reported on 09/11/2021 08/14/21   Regan Lemming, MD      Allergies    Patient has no known allergies.    Review of Systems   Review of Systems See HPI  Physical Exam Updated Vital Signs BP (!) 153/85 (BP Location: Left Arm)   Pulse (!) 55   Temp 97.6 F (36.4 C) (Oral)   Resp 19   Ht 5' 11"  (1.803 m)   Wt 109.8 kg   SpO2 97%   BMI 33.75 kg/m  Physical Exam Vitals and nursing note reviewed.  Constitutional:      General: He is not in acute distress.    Appearance: Normal appearance.  HENT:     Mouth/Throat:     Mouth: Mucous membranes are moist.  Cardiovascular:     Rate and Rhythm: Normal rate and regular rhythm.  Pulmonary:     Effort: Pulmonary effort is normal. No respiratory distress.     Breath sounds: Normal breath sounds.  Abdominal:     General: Abdomen is flat.     Palpations: Abdomen is soft.     Tenderness: There is no abdominal tenderness.  Musculoskeletal:     Right lower leg: Edema present.     Left lower leg: Edema present.     Comments:  1+ bilateral peripheral edema  Skin:    General: Skin is warm and dry.     Capillary Refill: Capillary refill takes less than 2 seconds.  Neurological:     Mental Status: He is alert and oriented to person, place, and time. Mental status is at baseline.  Psychiatric:        Mood and Affect: Mood normal.        Behavior: Behavior normal.     ED Results / Procedures / Treatments   Labs (all labs ordered are listed, but only abnormal results are displayed) Labs Reviewed  BASIC METABOLIC PANEL - Abnormal; Notable for the following components:      Result Value   Glucose, Bld 112 (*)    All other components within normal limits  CBC WITH DIFFERENTIAL/PLATELET - Abnormal; Notable for the following components:   RBC 4.11 (*)     Hemoglobin 12.2 (*)    HCT 35.3 (*)    All other components within normal limits  BRAIN NATRIURETIC PEPTIDE  TROPONIN I (HIGH SENSITIVITY)  TROPONIN I (HIGH SENSITIVITY)    EKG EKG Interpretation  Date/Time:  Tuesday September 11 2021 10:20:08 EDT Ventricular Rate:  64 PR Interval:  165 QRS Duration: 89 QT Interval:  412 QTC Calculation: 426 R Axis:   16 Text Interpretation: Sinus rhythm Borderline T abnormalities, inferior leads unchanged from prior Confirmed by Garnette Gunner [phone]) on 09/11/2021 10:30:29 AM  Radiology DG Chest Port 1 View  Result Date: 09/11/2021 CLINICAL DATA:  Hypertension EXAM: PORTABLE CHEST 1 VIEW COMPARISON:  Radiographs 01/08/2022 FINDINGS: The heart size and mediastinal contours are within normal limits. Mild lingular scarring. No focal consolidation, pleural effusion, or pneumothorax. The visualized skeletal structures are unremarkable. IMPRESSION: No active disease. Electronically Signed   By: Placido Sou M.D.   On: 09/11/2021 11:00    Procedures Procedures    Medications Ordered in ED Medications  amLODipine (NORVASC) tablet 5 mg (5 mg Oral Given 09/11/21 1107)    ED Course/ Medical Decision Making/ A&P Clinical Course as of 09/11/21 1530  Tue Sep 11, 2021  1501 Repeat troponin negative. Labs reassuring without sign of CKD. CXR without sign of CHF and BNP negative. Blood pressure improved. Will prescribe amlodipine. Emphasized need for very close PMD follow up. Will discharge patient to home. All questions answered. Patient comfortable with plan of discharge. Return precautions discussed with patient and specified on the after visit summary.  [WS]    Clinical Course User Index [WS] Cristie Hem, MD                           Medical Decision Making Amount and/or Complexity of Data Reviewed Labs: ordered. Radiology: ordered.  Risk Prescription drug management.   61 year old male presenting to the emergency department with  hypertension.  Patient is very hypertensive in the emergency department to 315 systolic but otherwise well-appearing.  Exam reassuring other than mild bilateral peripheral edema.  Low concern for hypertensive emergency but will check labs given leg swelling to evaluate for renal process or occult CHF.  Doubt ACS, no chest pain, EKG without acute ST or T wave changes concerning for ischemia, but given patient did have dyspnea when climbing stairs which is new to him he may ultimately need outpatient stress testing.  Doubt PE without tachycardia or chest pain.  Doubt dissection without chest pain.  If lab work-up is reassuring, likely initiate additional blood pressure medication and close follow-up  with primary care doctor.  Reviewed prior records including ED note 08/14/2021 for back pain  Final Clinical Impression(s) / ED Diagnoses Final diagnoses:  Hypertension, unspecified type    Rx / DC Orders ED Discharge Orders          Ordered    amLODipine (NORVASC) 5 MG tablet  Daily        09/11/21 1500              Cristie Hem, MD 09/11/21 1531

## 2021-09-19 ENCOUNTER — Other Ambulatory Visit: Payer: Self-pay | Admitting: Internal Medicine

## 2021-09-19 DIAGNOSIS — I10 Essential (primary) hypertension: Secondary | ICD-10-CM

## 2021-11-16 LAB — COLOGUARD: COLOGUARD: NEGATIVE

## 2022-04-29 NOTE — Progress Notes (Unsigned)
Brad Hayes., male    DOB: 02-28-1960   MRN: [id number]   Brief patient profile:  25  yobm never smoker with "new cough" in 03/2019 (actually recorded in Brad Hayes note 04/22/19 as recurrent and "typically pcp calls in cough med that is the only thing that works" with 1st rx for tussionex  2016 in [facility])   referred to pulmonary clinic 04/27/2020 by Brad  Brad Hayes with FENO of 11 05/12/19 with c/o of cough that only responds to narcotic containing cough meds.      History of Present Illness  04/27/2020  Pulmonary/ 1st office eval/Brad Hayes on trelegy taking only occ  Chief Complaint  Patient presents with   Pulmonary Consult    Referred by Brad. Scarlette Hayes. Pt c/o cough for the past year. Cough is non prod and tends to be worse at night and sometimes keeps him up.   Dyspnea:  No longer working out but Not limited by breathing from desired activities   Cough: dry, wakes up with cough once a week / at some point coughs during the day too but no pattern at all/no pets in bedroom /no triggers Sleep: as above sleeps prone  SABA use: none / on trelegy last dose x 2 weeks not helping  Rec Pantoprazole (protonix) 40 mg   Take  30-60 min before first meal of the day and Pepcid (famotidine)  20 mg one after supper until return to office  Prednisone 10 mg take  4 each am x 2 days,   2 each am x 2 days,  1 each am x 2 days and stop  GERD diet reviewed, bed blocks rec  For cough >  Tessalon 200 mg every 6 hours as needed  For drainage, tickle > For drainage / throat tickle try take (OTC) CHLORPHENIRAMINE  4 mg   Please schedule a follow up office visit in 4 weeks, sooner if needed  - next step is gabapentin     04/30/2022  f/u ov/Brad Hayes re: noct cough/ insomnia   maint on otc sleep aides/ does not recall response to gerd rx - does not recognize chlorpheniramine as something he ever took   Chief Complaint  Patient presents with   Acute Visit    Trouble with insomnia and only sleeping approx 4 hours at night.  He also c/o cough that gets worse at night- non prod.  Dyspnea:  no aerobics  Cough: better but still most nights  does wake him up  Sleeping:flat /  2 am turns off tv and up at 5 am and doesn't feel rested x 5 years  SABA use: none  02: none  Covid status:   vax x 2 / never infected  Snores per wife, moderate daytime hypersomnolence      No obvious day to day or daytime variability or assoc excess/ purulent sputum or mucus plugs or hemoptysis or cp or chest tightness, subjective wheeze or overt sinus or hb symptoms.     Also denies any obvious fluctuation of symptoms with weather or environmental changes or other aggravating or alleviating factors except as outlined above   No unusual exposure hx or h/o childhood pna/ asthma or knowledge of premature birth.  Current Allergies, Complete Past Medical History, Past Surgical History, Family History, and Social History were reviewed in [employer] record.  ROS  The following are not active complaints unless bolded Hoarseness, sore throat, dysphagia, dental problems, itching, sneezing,  nasal congestion or discharge of excess  mucus or purulent secretions, ear ache,   fever, chills, sweats, unintended wt loss or wt gain, classically pleuritic or exertional cp,  orthopnea pnd or arm/hand swelling  or leg swelling, presyncope, palpitations, abdominal pain, anorexia, nausea, vomiting, diarrhea  or change in bowel habits or change in bladder habits, change in stools or change in urine, dysuria, hematuria,  rash, arthralgias, visual complaints, headache, numbness, weakness or ataxia or problems with walking or coordination,  change in mood or  memory.        Current Meds  Medication Sig   amLODipine (NORVASC) 5 MG tablet Take 1 tablet (5 mg total) by mouth daily.   cyclobenzaprine (FLEXERIL) 10 MG tablet Take 1 tablet (10 mg total) by mouth 2 (two) times daily as needed for muscle spasms.   diphenhydrAMINE HCl, Sleep, (ZZZQUIL)  25 MG CAPS Take 1 tablet by mouth at bedtime.   lidocaine (LIDODERM) 5 % Place 1 patch onto the skin daily. Remove & Discard patch within 12 hours or as directed by MD   naproxen (NAPROSYN) 500 MG tablet Take 1 tablet (500 mg total) by mouth 2 (two) times daily.   Nebivolol HCl 20 MG TABS TAKE 1 TABLET BY MOUTH EVERY DAY               .  Past Medical History:  Diagnosis Date   Chronic insomnia    Hypertension    OSA (obstructive sleep apnea)        Objective:     Wt Readings from Last 3 Encounters:  04/30/22 245 lb (111.1 kg)  09/11/21 242 lb (109.8 kg)  08/14/21 243 lb (110.2 kg)      Vital signs reviewed  04/30/2022  - Note at rest 02 sats  97% on RA   General appearance:    amb bm nad   HEENT : Oropharynx  clear / M3 airway      Nasal turbinates mild non-specific edema    NECK :  without  apparent JVD/ palpable Nodes/TM    LUNGS: no acc muscle use,  Nl contour chest which is clear to A and P bilaterally without cough on insp or exp maneuvers   CV:  RRR  no s3 or murmur or increase in P2, and no edema   ABD:  soft and nontender with nl inspiratory excursion in the supine position. No bruits or organomegaly appreciated   MS:  Nl gait/ ext warm without deformities Or obvious joint restrictions  calf tenderness, cyanosis or clubbing    SKIN: warm and dry without lesions    NEURO:  alert, approp, nl sensorium with  no motor or cerebellar deficits apparent.           Assessment

## 2022-04-30 ENCOUNTER — Encounter: Payer: Self-pay | Admitting: Internal Medicine

## 2022-04-30 ENCOUNTER — Ambulatory Visit: Payer: BC Managed Care – PPO | Admitting: Internal Medicine

## 2022-04-30 VITALS — BP 130/76 | HR 66 | Temp 98.3°F | Ht 71.0 in | Wt 245.0 lb

## 2022-04-30 DIAGNOSIS — R058 Other specified cough: Secondary | ICD-10-CM | POA: Diagnosis not present

## 2022-04-30 NOTE — Assessment & Plan Note (Signed)
Onset 04/2019 no response to Trelegy /singulair/clariton  - max rx for gerd/ tessalon and 1st gen H1 blockers per guidelines 04/27/2020 >>> did not return for f/u - 04/30/2022 rechallenge with 1st gen H1 blockers per guidelines  and add pepcid at hs if not improving   He likely has 2 different problems with getting enough sleep but it starts with the cough and poor sleep hygiene which hopefully we addressed today see - avs for instructions unique to this ov   F/u with sleep medicine once we have the quantity of time in bed stretched to > 6 hours as likely needs at least HST at that point unless hypersomnolence resolves.   Etiology and pathophysiology of osa including relationship to obesity reviewed in detail  > rec aerobic ex/ wt loss          Each maintenance medication was reviewed in detail including emphasizing most importantly the difference between maintenance and prns and under what circumstances the prns are to be triggered using an action plan format where appropriate.  Total time for H and P, chart review, counseling,  and generating customized AVS unique to this office visit / same day charting = 31 min with pt not seen in 2 y

## 2022-04-30 NOTE — Patient Instructions (Addendum)
For drainage / throat tickle try take CHLORPHENIRAMINE  4 mg  ("Allergy Relief" '4mg'$   at [facility] should be easiest to find in the blue box usually on bottom shelf)  take one every 4 hours as needed - extremely effective and inexpensive over the counter- may cause drowsiness so start with just a dose or two an hour before bedtime and see how you tolerate it before trying in daytime.   If not improving with cough add pepcid 20 mg an hour before bed   Starting reading at 9 pm and go to bed at 10 pm and if can't sleep then get up and read another 30 min  Regular aerobic exercise will help

## 2023-05-19 ENCOUNTER — Encounter: Payer: Self-pay | Admitting: Internal Medicine

## 2023-07-25 IMAGING — DX DG CHEST 2V
2 series · 2 of 2 positions shown · non-contrast
Comparison: 04/27/2020

CLINICAL DATA: Chest pain

EXAM:
CHEST - 2 VIEW

[chest pa]
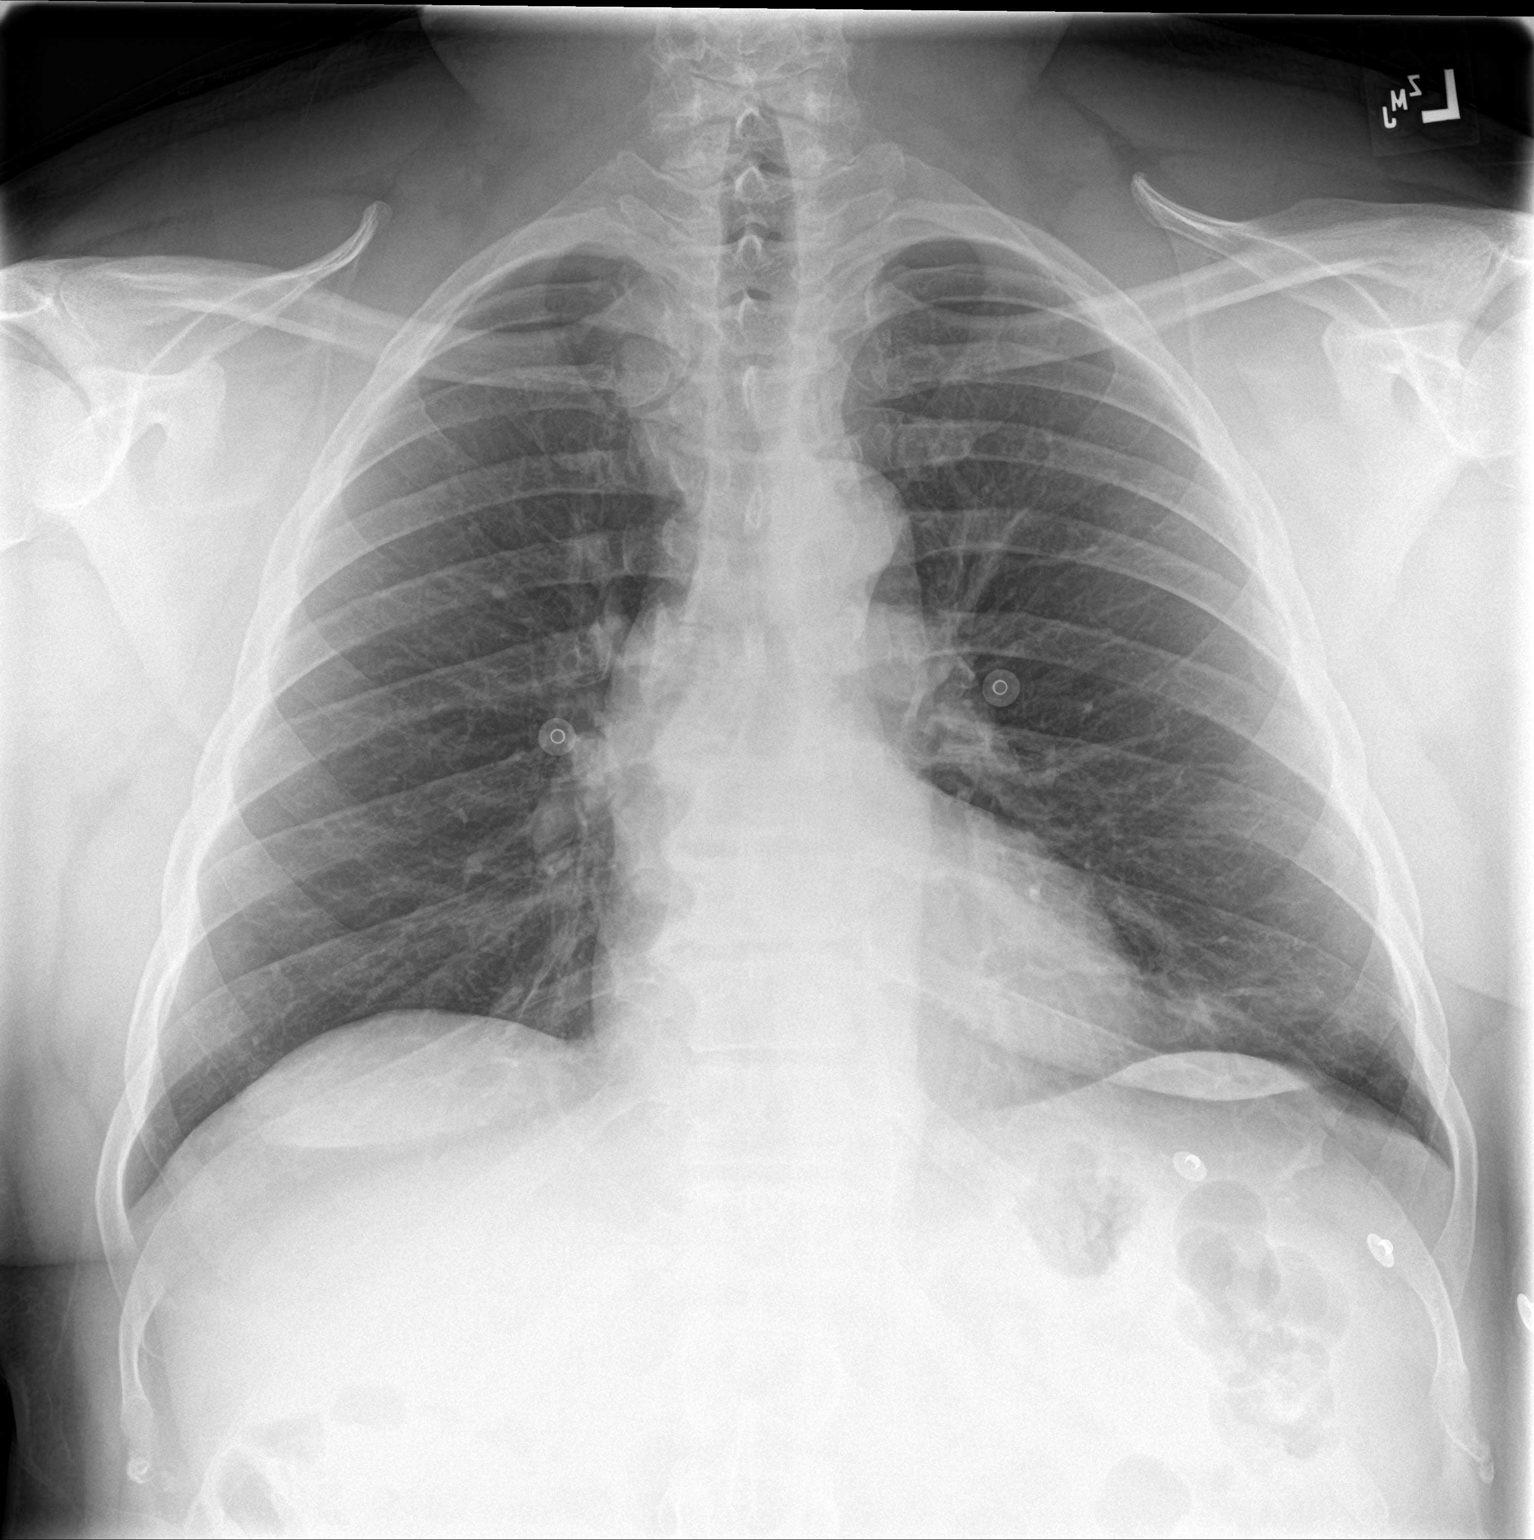

[chest lat]
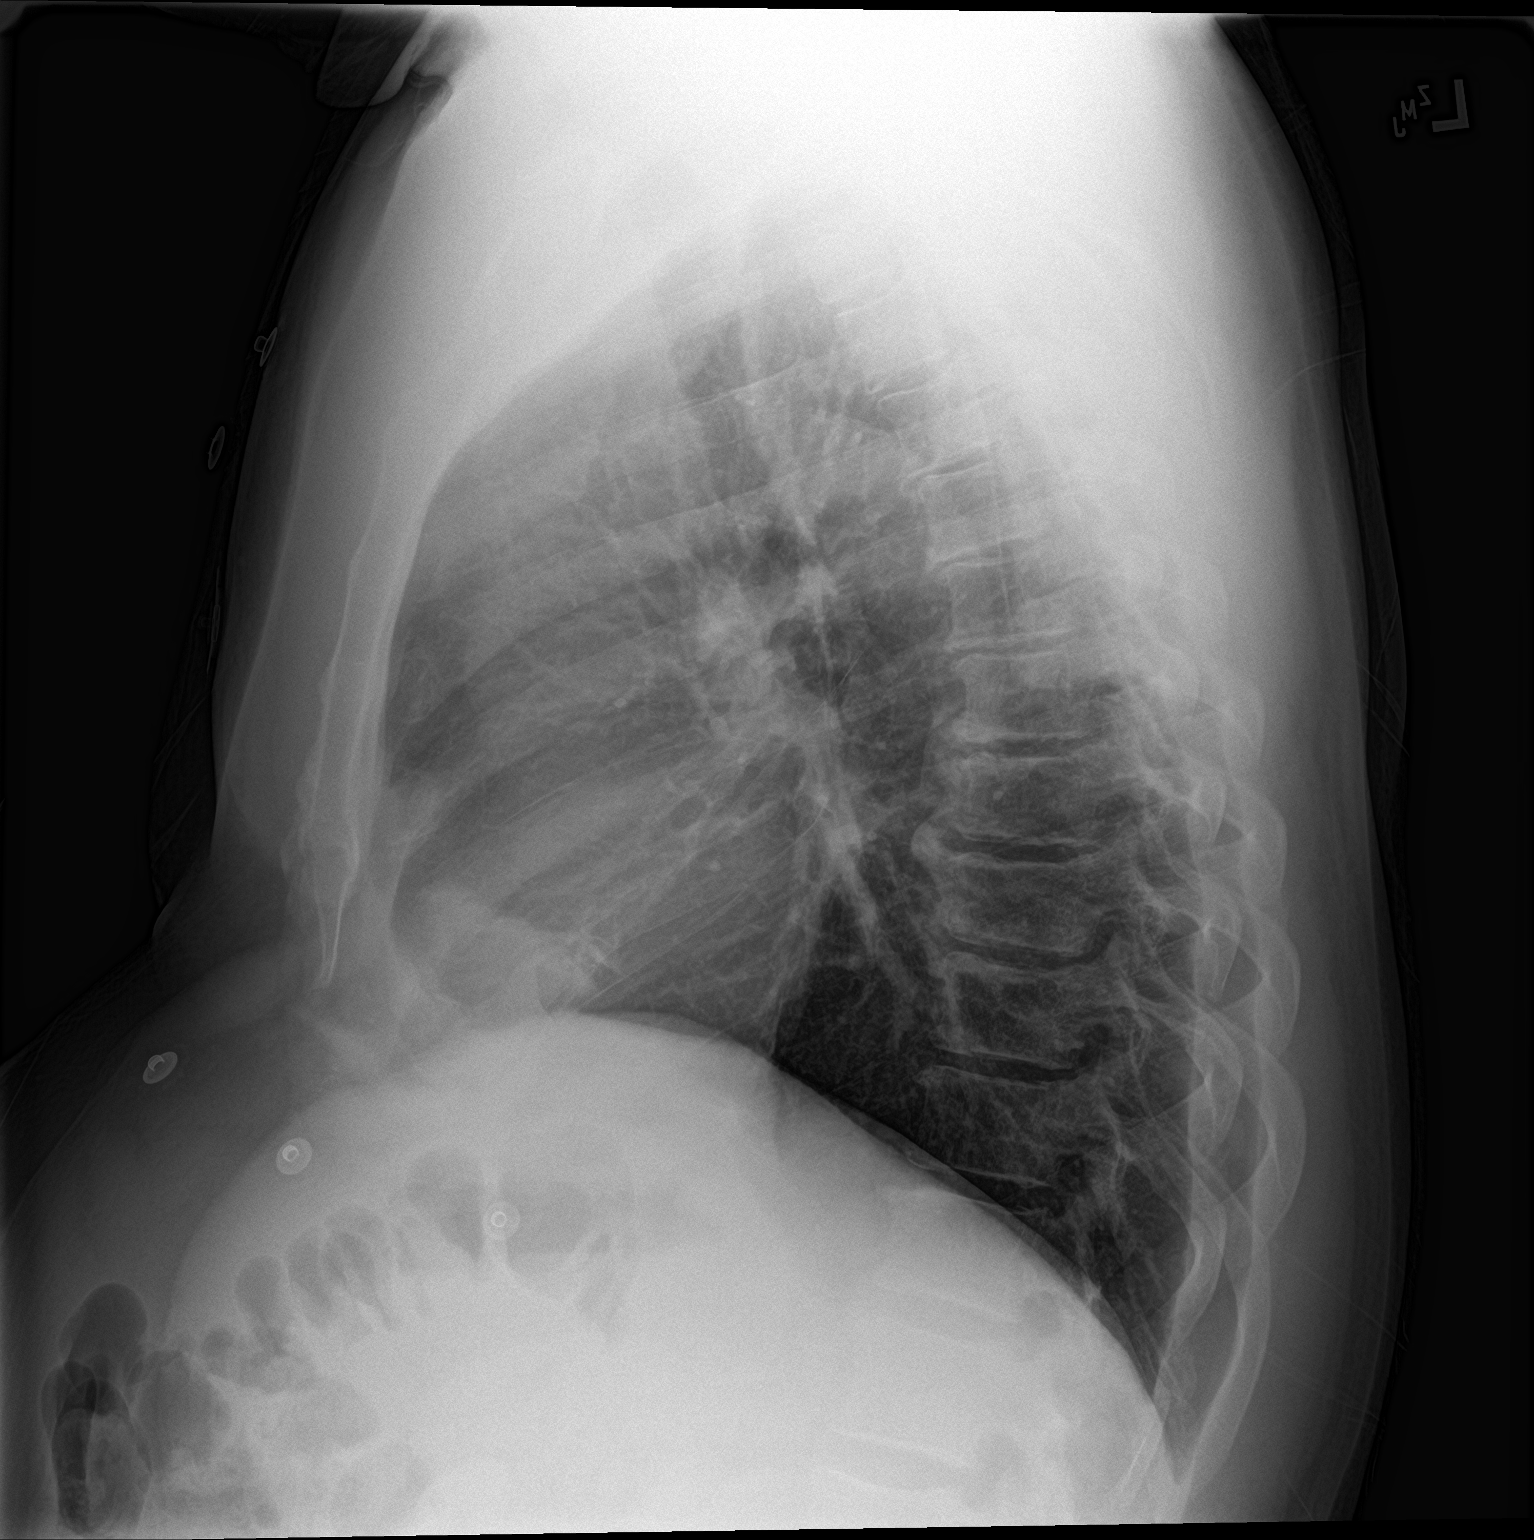

[2 of 2 positions shown; findings below may reference images not displayed]

FINDINGS: Mild lingular scarring. No focal consolidation. No pleural effusion
or pneumothorax. Heart and mediastinal contours are unremarkable.

No acute osseous abnormality.
IMPRESSION: No active cardiopulmonary disease.
